# Patient Record
Sex: Male | Born: 1960 | Race: Black or African American | Hispanic: No | Marital: Married | Smoking: Never smoker
Health system: Southern US, Community
[De-identification: ages and names within clinical notes are randomized; demographics above are authoritative.]

## PROBLEM LIST (undated history)

## (undated) DIAGNOSIS — J302 Other seasonal allergic rhinitis: Secondary | ICD-10-CM

## (undated) DIAGNOSIS — J45909 Unspecified asthma, uncomplicated: Secondary | ICD-10-CM

## (undated) DIAGNOSIS — R51 Headache: Secondary | ICD-10-CM

## (undated) DIAGNOSIS — I10 Essential (primary) hypertension: Secondary | ICD-10-CM

## (undated) HISTORY — PX: OTHER SURGICAL HISTORY: SHX169

## (undated) HISTORY — PX: APPENDECTOMY: SHX54

## (undated) HISTORY — PX: TUMOR REMOVAL: SHX12

## (undated) HISTORY — PX: TONSILLECTOMY: SUR1361

---

## 1998-02-22 ENCOUNTER — Other Ambulatory Visit: Admission: RE | Admit: 1998-02-22 | Discharge: 1998-02-22 | Payer: Self-pay | Admitting: Urology

## 2004-07-24 ENCOUNTER — Encounter: Admission: RE | Admit: 2004-07-24 | Discharge: 2004-07-24 | Payer: Self-pay | Admitting: Occupational Medicine

## 2008-06-06 ENCOUNTER — Encounter: Admission: RE | Admit: 2008-06-06 | Discharge: 2008-06-06 | Payer: Self-pay | Admitting: Occupational Medicine

## 2010-10-19 ENCOUNTER — Encounter: Payer: Self-pay | Admitting: Family Medicine

## 2013-08-15 NOTE — H&P (Signed)
History of Present Illness  The patient is a 52 year old male who presents today for follow up of their neck. The patient is being followed for their left-sided neck pain (and left shoulder pain for about one year after a a fall froma ladder that has gotten worse). They are 3 week(s) out from last visit. Symptoms reported today include: pain (moved into right side of neck and right shoulder, lower central posterior neck), pain at night and numbness (in left small and ring fingers, feels tingling in left middle finger). The patient feels that they are doing poorly and report their pain level to be severe and 6.5-7.5 / 10. Current treatment includes: pain medications (Oxycodone and Flexeril from Dr Ranell Patrick). The patient presents today following EMG/NCV (07/01/2013) and SNRB (Left C6-C7 SNRB with Dr. Ethelene Hal, patient had relief for about 3 days).   Subjective Transcription  HISTORY OF PRESENT ILLNESS:  The patient returns today for follow up. The nerve conduction test done on 07/01/13 showed no evidence of peripheral neuropathy. He essentially has a normal study. He did get the left C6-7 selective nerve root block and he had an exceptionally positive result for about three to four days. There was no radicular arm pain. He felt quite good and it has gradually and significantly returned.       Allergies No Known Drug Allergies. 03/08/2013    Social History Alcohol use. current drinker; drinks beer; less than 5 per week Children. 3 Current work status. working full time Drug/Alcohol Rehab (Currently). no Exercise. Exercises weekly; does running / walking Illicit drug use. no Living situation. live with spouse Marital status. married Most recent primary occupation. IMPORTS COORDINATOR Number of flights of stairs before winded. greater than 5 Pain Contract. no Previously in rehab. no Tobacco / smoke exposure. no Tobacco use. Never smoker. never  smoker    Medication History ProAir HFA (108 (90 Base)MCG/ACT Aerosol Soln, Inhalation) Active. (prn) Hydrochlorothiazide (12.5MG  Capsule, Oral) Active. (qd) Flexeril (10MG  Tablet, 1 Oral as needed) Active. Medications Reconciled.    Objective Transcription  On clinical exam he is alert and oriented times three. No shortness of breath or chest pain. The abdomen is soft and nontender. No history of incontinence of bowel and bladder. He still has trace weakness of his triceps, biceps, and wrist extensors on the left side. He has symmetrical deep tendon reflexes. Positive Spurling's sign. Positive Lhermitte's sign on the left side. Negative Babinski. Negative Hoffman's. Intact peripheral pulses. No history of incontinence of bowel or bladder.   His MRI dated 06-03-13 shows an incompletely imaged left thyroid nodule, and I have given him a copy of this report to take to [facility] so that appropriate further work up, if indicated, could be done. 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 It is a little bit more severe at 6-7 on the left.   Assessment & Plan Current Plans l Risks of surgery include, but are not limited to: Throat pain,swallowing difficulty, hoarseness or change in voice, Death, stroke, paralysis, nerve root damage/injury, bleeding, blood clots, loss of bowel/bladder control, hardware failure, or malposition, spinal fluid leak, adjacent segment disease, non-union, need for further surgery, ongoing or worse pain, infection and recurrent disc herniation  At this point in time we have had a long discussion about anterior cervical discectomy and fusion. The patient has a confirmatory test with pain elimination at  the C6-7 injection. He has no evidence of peripheral neuropathy. 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    At this point in time given the disease process and the failure to improve with appropriate conservative management he would like to proceed with surgery. I have had an honest discussion with the patient and his wife. The risks of that include infection, bleeding, nerve damage, death, stoke, paralysis, failure to heal, failure to fuse, throat pain, swallowing difficulties, hoarseness in the voice, need for further surgery including posterior surgery, ongoing or worse pain, loss of bowel and bladder control, blood clots, adjacent segment disease. All of their questions were addressed. Because this is a multilevel procedure we will use an external bone stimulator following surgery to encourage fusion. The procedure of choice would be ACDF C5-6, C6-7 which would address the C6-7 pathology that he has on the left side. All of their questions were encouraged and addressed. We will plan on proceeding with surgery in the very near future once we have primary care clearance.

## 2013-08-16 ENCOUNTER — Encounter (HOSPITAL_COMMUNITY): Payer: Self-pay

## 2013-08-16 ENCOUNTER — Encounter (HOSPITAL_COMMUNITY)
Admission: RE | Admit: 2013-08-16 | Discharge: 2013-08-16 | Disposition: A | Discharge status: Home / Self Care | Payer: Managed Care, Other (non HMO) | Source: Ambulatory Visit | Attending: Orthopedic Surgery | Admitting: Orthopedic Surgery

## 2013-08-16 ENCOUNTER — Encounter (HOSPITAL_COMMUNITY): Payer: Self-pay | Admitting: Pharmacy Technician

## 2013-08-16 ENCOUNTER — Encounter (HOSPITAL_COMMUNITY)
Admission: RE | Admit: 2013-08-16 | Discharge: 2013-08-16 | Disposition: A | Discharge status: Home / Self Care | Payer: Managed Care, Other (non HMO) | Source: Ambulatory Visit | Attending: Anesthesiology | Admitting: Anesthesiology

## 2013-08-16 DIAGNOSIS — M542 Cervicalgia: Secondary | ICD-10-CM | POA: Diagnosis present

## 2013-08-16 DIAGNOSIS — M1990 Unspecified osteoarthritis, unspecified site: Secondary | ICD-10-CM | POA: Diagnosis not present

## 2013-08-16 DIAGNOSIS — G609 Hereditary and idiopathic neuropathy, unspecified: Secondary | ICD-10-CM | POA: Diagnosis present

## 2013-08-16 DIAGNOSIS — M129 Arthropathy, unspecified: Secondary | ICD-10-CM | POA: Diagnosis present

## 2013-08-16 DIAGNOSIS — M47812 Spondylosis without myelopathy or radiculopathy, cervical region: Secondary | ICD-10-CM | POA: Diagnosis present

## 2013-08-16 HISTORY — DX: Unspecified asthma, uncomplicated: J45.909

## 2013-08-16 HISTORY — DX: Headache: R51

## 2013-08-16 HISTORY — DX: Other seasonal allergic rhinitis: J30.2

## 2013-08-16 HISTORY — DX: Essential (primary) hypertension: I10

## 2013-08-16 LAB — BASIC METABOLIC PANEL
BUN: 16 mg/dL (ref 6–23)
CO2: 28 mEq/L (ref 19–32)
Calcium: 9.6 mg/dL (ref 8.4–10.5)
Creatinine, Ser: 1.02 mg/dL (ref 0.50–1.35)
GFR calc Af Amer: >90 mL/min (ref >90)
GFR calc non Af Amer: 84 mL/min — ABNORMAL LOW (ref >90)
Sodium: 141 mEq/L (ref 135–145)

## 2013-08-16 LAB — SURGICAL PCR SCREEN
MRSA, PCR: NEGATIVE
Staphylococcus aureus: NEGATIVE

## 2013-08-16 LAB — CBC
HCT: 43.7 % (ref 39.0–52.0)
MCHC: 36.2 g/dL — ABNORMAL HIGH (ref 30.0–36.0)
Platelets: 227 10*3/uL (ref 150–400)
RDW: 12.4 % (ref 11.5–15.5)
WBC: 6.2 10*3/uL (ref 4.0–10.5)

## 2013-08-16 MED ORDER — CEFAZOLIN SODIUM-DEXTROSE 2-3 GM-% IV SOLR
2.0000 g | INTRAVENOUS | Status: AC
  Administered 2013-08-17: 2 g via INTRAVENOUS

## 2013-08-16 NOTE — Pre-Procedure Instructions (Signed)
Jordan Kaiser  08/16/2013   Your procedure is scheduled on:  Thursday November 20 th   Report to [facility] Main Entrance "A" at call OR [phone] at 0800 AM for arrival time.  Call this number if you have problems the morning of surgery: [phone]   Remember:   Do not eat food or drink liquids after midnight Wednesday.   Take these medicines the morning of surgery with A SIP OF WATER: Hydrocodone and bring inhaler day of surgery   Do not wear jewelry.  Do not wear lotions, or powders. You may wear deodorant.             Men may shave face and neck.  Do not bring valuables to the hospital.  [facility] is not responsible for any belongings or valuables.               Contacts, dentures or bridgework may not be worn into surgery.  Leave suitcase in the car. After surgery it may be brought to your room.  For patients admitted to the hospital, discharge time is determined by your treatment team.               Patients discharged the day of surgery will not be allowed to drive home.    Special Instructions: Shower using CHG 2 nights before surgery and the night before surgery.  If you shower the day of surgery use CHG.  Use special wash - you have one bottle of CHG for all showers.  You should use approximately 1/3 of the bottle for each shower.   Please read over the following fact sheets that you were given: Pain Booklet, Coughing and Deep Breathing, MRSA Information and Surgical Site Infection Prevention

## 2013-08-17 ENCOUNTER — Encounter (HOSPITAL_COMMUNITY): Payer: Managed Care, Other (non HMO) | Admitting: Certified Registered Nurse Anesthetist

## 2013-08-17 ENCOUNTER — Encounter (HOSPITAL_COMMUNITY): Payer: Self-pay | Admitting: *Deleted

## 2013-08-17 ENCOUNTER — Inpatient Hospital Stay (HOSPITAL_COMMUNITY): Payer: Managed Care, Other (non HMO) | Admitting: Certified Registered Nurse Anesthetist

## 2013-08-17 ENCOUNTER — Encounter (HOSPITAL_COMMUNITY)
Admission: RE | Disposition: A | Discharge status: Home / Self Care | Payer: Self-pay | Source: Ambulatory Visit | Attending: Orthopedic Surgery

## 2013-08-17 ENCOUNTER — Observation Stay (HOSPITAL_COMMUNITY)
Admission: RE | Admit: 2013-08-17 | Discharge: 2013-08-18 | DRG: 473 | Disposition: A | Discharge status: Home / Self Care | Payer: Managed Care, Other (non HMO) | Source: Ambulatory Visit | Attending: Orthopedic Surgery | Admitting: Orthopedic Surgery

## 2013-08-17 ENCOUNTER — Inpatient Hospital Stay (HOSPITAL_COMMUNITY): Payer: Managed Care, Other (non HMO)

## 2013-08-17 ENCOUNTER — Other Ambulatory Visit: Payer: Self-pay

## 2013-08-17 ENCOUNTER — Observation Stay (HOSPITAL_COMMUNITY): Payer: Managed Care, Other (non HMO)

## 2013-08-17 DIAGNOSIS — M129 Arthropathy, unspecified: Secondary | ICD-10-CM | POA: Diagnosis present

## 2013-08-17 DIAGNOSIS — I10 Essential (primary) hypertension: Secondary | ICD-10-CM | POA: Diagnosis present

## 2013-08-17 DIAGNOSIS — J45909 Unspecified asthma, uncomplicated: Secondary | ICD-10-CM | POA: Diagnosis present

## 2013-08-17 DIAGNOSIS — M47812 Spondylosis without myelopathy or radiculopathy, cervical region: Principal | ICD-10-CM | POA: Diagnosis present

## 2013-08-17 DIAGNOSIS — Z01812 Encounter for preprocedural laboratory examination: Secondary | ICD-10-CM | POA: Insufficient documentation

## 2013-08-17 DIAGNOSIS — M1990 Unspecified osteoarthritis, unspecified site: Secondary | ICD-10-CM | POA: Insufficient documentation

## 2013-08-17 DIAGNOSIS — Z79899 Other long term (current) drug therapy: Secondary | ICD-10-CM | POA: Insufficient documentation

## 2013-08-17 DIAGNOSIS — Z981 Arthrodesis status: Secondary | ICD-10-CM

## 2013-08-17 DIAGNOSIS — Z01818 Encounter for other preprocedural examination: Secondary | ICD-10-CM | POA: Insufficient documentation

## 2013-08-17 DIAGNOSIS — G609 Hereditary and idiopathic neuropathy, unspecified: Secondary | ICD-10-CM | POA: Diagnosis present

## 2013-08-17 HISTORY — PX: ANTERIOR CERVICAL DECOMP/DISCECTOMY FUSION: SHX1161

## 2013-08-17 SURGERY — ANTERIOR CERVICAL DECOMPRESSION/DISCECTOMY FUSION 2 LEVELS
Anesthesia: General | Site: Spine Cervical | Wound class: Clean

## 2013-08-17 MED ORDER — SODIUM CHLORIDE 0.9 % IJ SOLN: 3.0000 mL | INTRAMUSCULAR | Status: DC | PRN

## 2013-08-17 MED ORDER — THROMBIN 20000 UNITS EX SOLR
CUTANEOUS | Status: AC
  Filled 2013-08-17: qty 20000

## 2013-08-17 MED ORDER — MENTHOL 3 MG MT LOZG: 1.0000 | LOZENGE | OROMUCOSAL | Status: DC | PRN

## 2013-08-17 MED ORDER — ARTIFICIAL TEARS OP OINT
TOPICAL_OINTMENT | OPHTHALMIC | Status: DC | PRN
  Administered 2013-08-17: 1 via OPHTHALMIC

## 2013-08-17 MED ORDER — ZOLPIDEM TARTRATE 5 MG PO TABS: 5.0000 mg | ORAL_TABLET | Freq: Every evening | ORAL | Status: DC | PRN

## 2013-08-17 MED ORDER — BUPIVACAINE-EPINEPHRINE 0.25% -1:200000 IJ SOLN
INTRAMUSCULAR | Status: DC | PRN
  Administered 2013-08-17: 3 mL

## 2013-08-17 MED ORDER — PROPOFOL 10 MG/ML IV BOLUS
INTRAVENOUS | Status: DC | PRN
  Administered 2013-08-17: 300 mg via INTRAVENOUS

## 2013-08-17 MED ORDER — PHENYLEPHRINE HCL 10 MG/ML IJ SOLN
10.0000 mg | INTRAVENOUS | Status: DC | PRN
  Administered 2013-08-17: 25 ug/min via INTRAVENOUS

## 2013-08-17 MED ORDER — DEXAMETHASONE 4 MG PO TABS
4.0000 mg | ORAL_TABLET | Freq: Four times a day (QID) | ORAL | Status: DC
  Administered 2013-08-18: 4 mg via ORAL
  Filled 2013-08-17 (×7): qty 1

## 2013-08-17 MED ORDER — CEFAZOLIN SODIUM 1-5 GM-% IV SOLN
1.0000 g | Freq: Three times a day (TID) | INTRAVENOUS | Status: AC
  Administered 2013-08-17 – 2013-08-18 (×2): 1 g via INTRAVENOUS
  Filled 2013-08-17 (×2): qty 50

## 2013-08-17 MED ORDER — FENTANYL CITRATE 0.05 MG/ML IJ SOLN
INTRAMUSCULAR | Status: DC | PRN
  Administered 2013-08-17: 175 ug via INTRAVENOUS
  Administered 2013-08-17: 75 ug via INTRAVENOUS

## 2013-08-17 MED ORDER — GLYCOPYRROLATE 0.2 MG/ML IJ SOLN
INTRAMUSCULAR | Status: DC | PRN
  Administered 2013-08-17: 0.4 mg via INTRAVENOUS

## 2013-08-17 MED ORDER — NEOSTIGMINE METHYLSULFATE 1 MG/ML IJ SOLN
INTRAMUSCULAR | Status: DC | PRN
  Administered 2013-08-17: 3 mg via INTRAVENOUS

## 2013-08-17 MED ORDER — LIDOCAINE HCL (CARDIAC) 20 MG/ML IV SOLN
INTRAVENOUS | Status: DC | PRN
  Administered 2013-08-17: 100 mg via INTRAVENOUS

## 2013-08-17 MED ORDER — ALBUTEROL SULFATE HFA 108 (90 BASE) MCG/ACT IN AERS
1.0000 | INHALATION_SPRAY | Freq: Four times a day (QID) | RESPIRATORY_TRACT | Status: DC | PRN
Start: 2013-08-17 — End: 2013-08-18
  Filled 2013-08-17 (×2): qty 6.7

## 2013-08-17 MED ORDER — ACETAMINOPHEN 10 MG/ML IV SOLN
INTRAVENOUS | Status: DC | PRN
  Administered 2013-08-17: 1000 mg via INTRAVENOUS

## 2013-08-17 MED ORDER — ACETAMINOPHEN 10 MG/ML IV SOLN
1000.0000 mg | INTRAVENOUS | Status: DC
  Filled 2013-08-17: qty 100

## 2013-08-17 MED ORDER — HYDROCHLOROTHIAZIDE 12.5 MG PO CAPS
12.5000 mg | ORAL_CAPSULE | Freq: Every day | ORAL | Status: DC
  Administered 2013-08-18: 12.5 mg via ORAL
  Filled 2013-08-17: qty 1

## 2013-08-17 MED ORDER — SODIUM CHLORIDE 0.9 % IJ SOLN
3.0000 mL | Freq: Two times a day (BID) | INTRAMUSCULAR | Status: DC
  Administered 2013-08-18: 3 mL via INTRAVENOUS

## 2013-08-17 MED ORDER — SODIUM CHLORIDE 0.9 % IV SOLN
250.0000 mL | INTRAVENOUS | Status: DC
  Administered 2013-08-17: 250 mL via INTRAVENOUS

## 2013-08-17 MED ORDER — LACTATED RINGERS IV SOLN
INTRAVENOUS | Status: DC | PRN
  Administered 2013-08-17 (×2): via INTRAVENOUS

## 2013-08-17 MED ORDER — 0.9 % SODIUM CHLORIDE (POUR BTL) OPTIME
TOPICAL | Status: DC | PRN
  Administered 2013-08-17: 1000 mL

## 2013-08-17 MED ORDER — DEXAMETHASONE SODIUM PHOSPHATE 4 MG/ML IJ SOLN
4.0000 mg | Freq: Once | INTRAMUSCULAR | Status: AC
  Administered 2013-08-17: 8 mg via INTRAVENOUS
  Filled 2013-08-17: qty 1

## 2013-08-17 MED ORDER — METHOCARBAMOL 500 MG PO TABS
500.0000 mg | ORAL_TABLET | Freq: Four times a day (QID) | ORAL | Status: DC | PRN
  Administered 2013-08-17: 500 mg via ORAL
  Filled 2013-08-17: qty 1

## 2013-08-17 MED ORDER — HYDROMORPHONE HCL PF 1 MG/ML IJ SOLN
INTRAMUSCULAR | Status: AC
  Filled 2013-08-17: qty 1

## 2013-08-17 MED ORDER — MIDAZOLAM HCL 5 MG/5ML IJ SOLN
INTRAMUSCULAR | Status: DC | PRN
  Administered 2013-08-17: 2 mg via INTRAVENOUS

## 2013-08-17 MED ORDER — LACTATED RINGERS IV SOLN
INTRAVENOUS | Status: DC
  Administered 2013-08-17: 13:00:00 via INTRAVENOUS

## 2013-08-17 MED ORDER — ACETAMINOPHEN 10 MG/ML IV SOLN
1000.0000 mg | Freq: Four times a day (QID) | INTRAVENOUS | Status: DC
  Administered 2013-08-18 (×3): 1000 mg via INTRAVENOUS
  Filled 2013-08-17 (×4): qty 100

## 2013-08-17 MED ORDER — PHENOL 1.4 % MT LIQD: 1.0000 | OROMUCOSAL | Status: DC | PRN

## 2013-08-17 MED ORDER — DEXAMETHASONE SODIUM PHOSPHATE 4 MG/ML IJ SOLN
4.0000 mg | Freq: Four times a day (QID) | INTRAMUSCULAR | Status: DC
  Administered 2013-08-18: 4 mg via INTRAVENOUS
  Filled 2013-08-17 (×6): qty 1

## 2013-08-17 MED ORDER — METHOCARBAMOL 500 MG PO TABS
ORAL_TABLET | ORAL | Status: AC
  Filled 2013-08-17: qty 1

## 2013-08-17 MED ORDER — ROCURONIUM BROMIDE 100 MG/10ML IV SOLN
INTRAVENOUS | Status: DC | PRN
  Administered 2013-08-17: 50 mg via INTRAVENOUS
  Administered 2013-08-17 (×2): 20 mg via INTRAVENOUS
  Administered 2013-08-17: 30 mg via INTRAVENOUS

## 2013-08-17 MED ORDER — HYDROMORPHONE HCL PF 1 MG/ML IJ SOLN
0.2500 mg | INTRAMUSCULAR | Status: DC | PRN
  Administered 2013-08-17 (×3): 0.5 mg via INTRAVENOUS

## 2013-08-17 MED ORDER — OXYCODONE HCL 5 MG/5ML PO SOLN: 5.0000 mg | Freq: Once | ORAL | Status: DC | PRN

## 2013-08-17 MED ORDER — OXYCODONE HCL 5 MG PO TABS
10.0000 mg | ORAL_TABLET | ORAL | Status: DC | PRN
  Administered 2013-08-17 – 2013-08-18 (×3): 10 mg via ORAL
  Filled 2013-08-17 (×3): qty 2

## 2013-08-17 MED ORDER — LACTATED RINGERS IV SOLN: INTRAVENOUS | Status: DC

## 2013-08-17 MED ORDER — ONDANSETRON HCL 4 MG/2ML IJ SOLN
INTRAMUSCULAR | Status: DC | PRN
  Administered 2013-08-17: 4 mg via INTRAVENOUS

## 2013-08-17 MED ORDER — ACETAMINOPHEN 10 MG/ML IV SOLN: 1000.0000 mg | Freq: Four times a day (QID) | INTRAVENOUS | Status: DC

## 2013-08-17 MED ORDER — OXYCODONE HCL 5 MG PO TABS: 5.0000 mg | ORAL_TABLET | Freq: Once | ORAL | Status: DC | PRN

## 2013-08-17 MED ORDER — METHOCARBAMOL 100 MG/ML IJ SOLN
500.0000 mg | Freq: Four times a day (QID) | INTRAVENOUS | Status: DC | PRN
  Filled 2013-08-17: qty 5

## 2013-08-17 MED ORDER — ONDANSETRON HCL 4 MG/2ML IJ SOLN: 4.0000 mg | INTRAMUSCULAR | Status: DC | PRN

## 2013-08-17 MED ORDER — MORPHINE SULFATE 2 MG/ML IJ SOLN: 1.0000 mg | INTRAMUSCULAR | Status: DC | PRN

## 2013-08-17 MED ORDER — THROMBIN 20000 UNITS EX SOLR
CUTANEOUS | Status: DC | PRN
  Administered 2013-08-17: 19:00:00 via TOPICAL

## 2013-08-17 SURGICAL SUPPLY — 60 items
BLADE SURG ROTATE 9660 (MISCELLANEOUS) IMPLANT
BUR EGG ELITE 4.0 (BURR) IMPLANT
BUR MATCHSTICK NEURO 3.0 LAGG (BURR) IMPLANT
CANISTER SUCTION 2500CC (MISCELLANEOUS) ×2 IMPLANT
CLOTH BEACON ORANGE TIMEOUT ST (SAFETY) ×2 IMPLANT
CLSR STERI-STRIP ANTIMIC 1/2X4 (GAUZE/BANDAGES/DRESSINGS) ×2 IMPLANT
CORDS BIPOLAR (ELECTRODE) ×2 IMPLANT
COVER SURGICAL LIGHT HANDLE (MISCELLANEOUS) ×4 IMPLANT
CRADLE DONUT ADULT HEAD (MISCELLANEOUS) ×2 IMPLANT
DEVICE ENDSKLTN MED 6 7MM (Orthopedic Implant) IMPLANT
DRAPE C-ARM 42X72 X-RAY (DRAPES) ×2 IMPLANT
DRAPE POUCH INSTRU U-SHP 10X18 (DRAPES) ×2 IMPLANT
DRAPE SURG 17X23 STRL (DRAPES) ×2 IMPLANT
DRAPE U-SHAPE 47X51 STRL (DRAPES) ×2 IMPLANT
DRSG MEPILEX BORDER 4X4 (GAUZE/BANDAGES/DRESSINGS) ×2 IMPLANT
DURAPREP 26ML APPLICATOR (WOUND CARE) ×2 IMPLANT
ELECT COATED BLADE 2.86 ST (ELECTRODE) ×2 IMPLANT
ELECT REM PT RETURN 9FT ADLT (ELECTROSURGICAL) ×2
ELECTRODE REM PT RTRN 9FT ADLT (ELECTROSURGICAL) ×1 IMPLANT
ENDOSKELETON MED 6 7MM (Orthopedic Implant) ×4 IMPLANT
GLOVE BIOGEL PI IND STRL 8 (GLOVE) ×1 IMPLANT
GLOVE BIOGEL PI IND STRL 8.5 (GLOVE) ×1 IMPLANT
GLOVE BIOGEL PI INDICATOR 8 (GLOVE) ×1
GLOVE BIOGEL PI INDICATOR 8.5 (GLOVE) ×1
GLOVE ECLIPSE 8.5 STRL (GLOVE) ×2 IMPLANT
GLOVE ORTHO TXT STRL SZ7.5 (GLOVE) ×2 IMPLANT
GOWN PREVENTION PLUS XXLARGE (GOWN DISPOSABLE) ×2 IMPLANT
GOWN STRL REIN 2XL XLG LVL4 (GOWN DISPOSABLE) ×2 IMPLANT
GOWN STRL REIN XL XLG (GOWN DISPOSABLE) ×4 IMPLANT
KIT BASIN OR (CUSTOM PROCEDURE TRAY) ×2 IMPLANT
KIT ROOM TURNOVER OR (KITS) ×2 IMPLANT
NDL SPNL 18GX3.5 QUINCKE PK (NEEDLE) ×1 IMPLANT
NEEDLE SPNL 18GX3.5 QUINCKE PK (NEEDLE) ×2 IMPLANT
NS IRRIG 1000ML POUR BTL (IV SOLUTION) ×2 IMPLANT
PACK ORTHO CERVICAL (CUSTOM PROCEDURE TRAY) ×2 IMPLANT
PACK UNIVERSAL I (CUSTOM PROCEDURE TRAY) ×2 IMPLANT
PAD ARMBOARD 7.5X6 YLW CONV (MISCELLANEOUS) ×4 IMPLANT
PATTIES SURGICAL .25X.25 (GAUZE/BANDAGES/DRESSINGS) ×2 IMPLANT
PIN RETAINER PRODISC 14 MM (PIN) ×2 IMPLANT
PLATE SKYLINE TWO LEVEL 32MM (Plate) ×1 IMPLANT
PUTTY BONE DBX 5CC MIX (Putty) ×2 IMPLANT
RESTRAINT LIMB HOLDER UNIV (RESTRAINTS) ×2 IMPLANT
SCREW SKYLINE 14MM SD-VA (Screw) ×6 IMPLANT
SPONGE INTESTINAL PEANUT (DISPOSABLE) ×2 IMPLANT
SPONGE LAP 4X18 X RAY DECT (DISPOSABLE) ×4 IMPLANT
SPONGE SURGIFOAM ABS GEL 100 (HEMOSTASIS) ×2 IMPLANT
SURGIFLO TRUKIT (HEMOSTASIS) IMPLANT
SUT MON AB 3-0 SH 27 (SUTURE) ×2
SUT MON AB 3-0 SH27 (SUTURE) ×1 IMPLANT
SUT SILK 2 0 (SUTURE)
SUT SILK 2-0 18XBRD TIE 12 (SUTURE) IMPLANT
SUT VIC AB 2-0 CT1 18 (SUTURE) ×2 IMPLANT
SYR BULB IRRIGATION 50ML (SYRINGE) ×2 IMPLANT
SYR CONTROL 10ML LL (SYRINGE) ×2 IMPLANT
TAPE CLOTH 4X10 WHT NS (GAUZE/BANDAGES/DRESSINGS) ×2 IMPLANT
TAPE UMBILICAL COTTON 1/8X30 (MISCELLANEOUS) ×2 IMPLANT
TOWEL OR 17X24 6PK STRL BLUE (TOWEL DISPOSABLE) ×2 IMPLANT
TOWEL OR 17X26 10 PK STRL BLUE (TOWEL DISPOSABLE) ×2 IMPLANT
TRAY FOLEY CATH 16FRSI W/METER (SET/KITS/TRAYS/PACK) IMPLANT
WATER STERILE IRR 1000ML POUR (IV SOLUTION) ×1 IMPLANT

## 2013-08-17 NOTE — Anesthesia Postprocedure Evaluation (Signed)
  Anesthesia Post-op Note  Patient: Jordan Kaiser  Procedure(s) Performed: Procedure(s): ANTERIOR CERVICAL DECOMPRESSION/DISCECTOMY FUSION 2 LEVELS C5-7 (N/A)  Patient Location: PACU  Anesthesia Type:General  Level of Consciousness: awake and alert   Airway and Oxygen Therapy: Patient Spontanous Breathing  Post-op Pain: mild  Post-op Assessment: Post-op Vital signs reviewed, Patient's Cardiovascular Status Stable and Respiratory Function Stable  Post-op Vital Signs: Reviewed  Filed Vitals:   08/17/13 2115  BP: 129/75  Pulse: 95  Temp: 36.5 C  Resp: 10    Complications: No apparent anesthesia complications

## 2013-08-17 NOTE — Brief Op Note (Signed)
08/17/2013  7:42 PM  PATIENT:  Jordan Kaiser  52 y.o. male  PRE-OPERATIVE DIAGNOSIS:  CERVICAL SPONDOLYTIC RADICULOPATHY  POST-OPERATIVE DIAGNOSIS:  CERVICAL SPONDOLYTIC RADICULOPATHY  PROCEDURE:  Procedure(s): ANTERIOR CERVICAL DECOMPRESSION/DISCECTOMY FUSION 2 LEVELS C5-7 (N/A)  SURGEON:  Surgeon(s) and Role:    * Venita Lick, MD - Primary  PHYSICIAN ASSISTANT:   ASSISTANTS: Zonia Kief   ANESTHESIA:   general  EBL:  Total I/O In: 150 [I.V.:150] Out: -   BLOOD ADMINISTERED:none  DRAINS: none   LOCAL MEDICATIONS USED:  MARCAINE     SPECIMEN:  No Specimen  DISPOSITION OF SPECIMEN:  N/A  COUNTS:  YES  TOURNIQUET:  * No tourniquets in log *  DICTATION: .Other Dictation: Dictation Number [id number]  PLAN OF CARE: Admit for overnight observation  PATIENT DISPOSITION:  PACU - hemodynamically stable.

## 2013-08-17 NOTE — Anesthesia Preprocedure Evaluation (Addendum)
Anesthesia Evaluation  Patient identified by MRN, date of birth, ID band Patient awake    Reviewed: Allergy & Precautions, H&P , NPO status , Patient's Chart, lab work & pertinent test results  Airway Mallampati: II TM Distance: >3 FB Neck ROM: Limited  Mouth opening: Limited Mouth Opening  Dental  (+) Teeth Intact, Dental Advisory Given and Caps   Pulmonary asthma ,    Pulmonary exam normal       Cardiovascular hypertension, Pt. on medications Rhythm:Regular Rate:Normal     Neuro/Psych  Headaches,    GI/Hepatic negative GI ROS, Neg liver ROS,   Endo/Other  negative endocrine ROS  Renal/GU negative Renal ROS     Musculoskeletal  (+) Arthritis -, Osteoarthritis,    Abdominal Normal abdominal exam  (+)   Peds  Hematology negative hematology ROS (+)   Anesthesia Other Findings Full uppers are crowned, many lower in the front are also crowned.  Reproductive/Obstetrics                        Anesthesia Physical Anesthesia Plan  ASA: II  Anesthesia Plan: General   Post-op Pain Management:    Induction: Intravenous  Airway Management Planned: Oral ETT  Additional Equipment:   Intra-op Plan:   Post-operative Plan:   Informed Consent: I have reviewed the patients History and Physical, chart, labs and discussed the procedure including the risks, benefits and alternatives for the proposed anesthesia with the patient or authorized representative who has indicated his/her understanding and acceptance.   Dental advisory given  Plan Discussed with:   Anesthesia Plan Comments:         Anesthesia Quick Evaluation

## 2013-08-17 NOTE — H&P (Signed)
No change in clinical exam H+P reviewed  

## 2013-08-17 NOTE — Transfer of Care (Signed)
Immediate Anesthesia Transfer of Care Note  Patient: Jordan Kaiser  Procedure(s) Performed: Procedure(s): ANTERIOR CERVICAL DECOMPRESSION/DISCECTOMY FUSION 2 LEVELS C5-7 (N/A)  Patient Location: PACU  Anesthesia Type:General  Level of Consciousness: awake, alert , oriented and patient cooperative  Airway & Oxygen Therapy: Patient Spontanous Breathing and Patient connected to nasal cannula oxygen  Post-op Assessment: Report given to PACU RN, Post -op Vital signs reviewed and stable and Patient moving all extremities X 4  Post vital signs: Reviewed and stable  Complications: No apparent anesthesia complications

## 2013-08-17 NOTE — Preoperative (Addendum)
Beta Blockers   Reason not to administer Beta Blockers:Not Applicable 

## 2013-08-17 NOTE — Anesthesia Procedure Notes (Signed)
Procedure Name: Intubation Date/Time: 08/17/2013 4:34 PM Performed by: Tyrone Nine Pre-anesthesia Checklist: Patient identified, Timeout performed, Emergency Drugs available, Suction available and Patient being monitored Patient Re-evaluated:Patient Re-evaluated prior to inductionOxygen Delivery Method: Circle system utilized Preoxygenation: Pre-oxygenation with 100% oxygen Intubation Type: IV induction Ventilation: Mask ventilation without difficulty Laryngoscope Size: Mac and 4 Grade View: Grade II Tube type: Oral Tube size: 8.0 mm Number of attempts: 1 Airway Equipment and Method: Stylet Placement Confirmation: ETT inserted through vocal cords under direct vision,  positive ETCO2 and breath sounds checked- equal and bilateral Secured at: 23 cm Tube secured with: Tape Dental Injury: Teeth and Oropharynx as per pre-operative assessment

## 2013-08-18 DIAGNOSIS — M47812 Spondylosis without myelopathy or radiculopathy, cervical region: Secondary | ICD-10-CM | POA: Diagnosis not present

## 2013-08-18 MED ORDER — ONDANSETRON 4 MG PO TBDP: 4.0000 mg | ORAL_TABLET | Freq: Three times a day (TID) | ORAL | Status: DC | PRN

## 2013-08-18 MED ORDER — METHOCARBAMOL 500 MG PO TABS: 500.0000 mg | ORAL_TABLET | Freq: Four times a day (QID) | ORAL | Status: DC | PRN

## 2013-08-18 MED ORDER — OXYCODONE-ACETAMINOPHEN 5-325 MG PO TABS: 1.0000 | ORAL_TABLET | ORAL | Status: DC | PRN

## 2013-08-18 MED ORDER — POLYETHYLENE GLYCOL 3350 17 G PO PACK: 17.0000 g | PACK | Freq: Every day | ORAL | Status: DC

## 2013-08-18 MED ORDER — DOCUSATE SODIUM 100 MG PO CAPS: 100.0000 mg | ORAL_CAPSULE | Freq: Two times a day (BID) | ORAL | Status: DC

## 2013-08-18 NOTE — Progress Notes (Signed)
Pt stated feeling severe crushing chest pain at a level of 8 of 10 with SOB. On call provider was paged. EKG was normal. VS were stable. Pt has hx of asthma and had albuterol PRN ordered. RT called to assess pt and need for albuterol. On call provider ordered to administer albuterol if needed and to continue to monitor. If pain did not subside after albuterol admission to page them back. Will continue to monitor.

## 2013-08-18 NOTE — Progress Notes (Signed)
    Subjective: Procedure(s) (LRB): ANTERIOR CERVICAL DECOMPRESSION/DISCECTOMY FUSION 2 LEVELS C5-7 (N/A) 1 Day Post-Op  Patient reports pain as 2 on 0-10 scale.  Reports decreased arm pain reports incisional neck pain   Positive void Negative bowel movement Positive flatus Positive chest pain overnight.  EKG negative.  Issue resolved  Objective: Vital signs in last 24 hours: Temp:  [97.2 F (36.2 C)-98.4 F (36.9 C)] 98.1 F (36.7 C) (11/21 0634) Pulse Rate:  [75-100] 91 (11/21 0634) Resp:  [4-20] 14 (11/21 0634) BP: (124-137)/(66-96) 127/66 mmHg (11/21 0634) SpO2:  [98 %-100 %] 100 % (11/21 0634)  Intake/Output from previous day: 11/20 0701 - 11/21 0700 In: 1888.3 [P.O.:120; I.V.:1568.3; IV Piggyback:200] Out: 565 [Urine:550; Blood:15]  Labs:  Recent Labs  08/16/13 1210  WBC 6.2  RBC 5.03  HCT 43.7  PLT 227    Recent Labs  08/16/13 1210  NA 141  K 3.9  CL 102  CO2 28  BUN 16  CREATININE 1.02  GLUCOSE 101*  CALCIUM 9.6   No results found for this basename: LABPT, INR,  in the last 72 hours  Physical Exam: Neurologically intact ABD soft Neurovascular intact Incision: dressing C/D/I and no drainage Compartment soft  Assessment/Plan: Patient stable  xrays satisfactory Mobilization with physical therapy Encourage incentive spirometry Continue care  Advance diet Up with therapy Episode of chest pain overnight which has resolved - most likely myofascial. Patient has voided with discomfort but denies hematuria. Ok for d/c this afternoon with urinary discomfort resolved.  If it does not will contact Dr Brunilda Payor (urology) for evaluation.   Venita Lick, MD [facility] [phone]

## 2013-08-18 NOTE — Progress Notes (Signed)
08/18/13 PT recommended outpatient PT. No equipment needs identified. Jacquelynn Cree RN, BSN, CCM

## 2013-08-18 NOTE — Progress Notes (Signed)
Received call from RN requesting to come to assess pt. Upon arrival pt. Stated that he felt much better. Pt. Explained that he was having chest pain that was causing him to be short of breath & he began to have a panic attack. BBS: RUL & LUL: Clear, RLL & LLL: Diminished, HR: 104, SAT's: 98% on 1.5L [place]. RN was made aware of RT assessment & that the pt. Was feeling much better.

## 2013-08-18 NOTE — Evaluation (Signed)
Physical Therapy Evaluation Patient Details Name: Jordan Kaiser MRN: [id number] DOB: September 26, 1961 Today's Date: 08/18/2013 Time: 4098-1191 PT Time Calculation (min): 25 min  PT Assessment / Plan / Recommendation History of Present Illness  Pt is a 52 y/o male admitted s/p anterior cervical decompression/discectomy fusion C5-C7.  Clinical Impression  This patient presents with acute pain and decreased functional independence following the above mentioned procedure. At the time of PT eval, pt required supervision for all functional mobility, as he felt somewhat dizzy the first time out of bed. Dizziness resolved by end of session and pt reports pain of 3/10. This patient is appropriate for skilled PT interventions in the acute care setting to address functional limitations, improve safety and independence with functional mobility, and return to PLOF.      PT Assessment  Patient needs continued PT services    Follow Up Recommendations  Outpatient PT (when appropriate)    Does the patient have the potential to tolerate intense rehabilitation      Barriers to Discharge        Equipment Recommendations  None recommended by PT    Recommendations for Other Services     Frequency Min 5X/week    Precautions / Restrictions Precautions Precautions: Fall;Cervical Required Braces or Orthoses: Cervical Brace Cervical Brace: Hard collar Restrictions Weight Bearing Restrictions: No   Pertinent Vitals/Pain 3/10 at rest      Mobility  Bed Mobility Bed Mobility: Not assessed Supine to Sit: 5: Supervision;HOB elevated Sitting - Scoot to Edge of Bed: 5: Supervision Details for Bed Mobility Assistance: VC's to maintain cervical precautions Transfers Transfers: Sit to Stand;Stand to Sit Sit to Stand: 5: Supervision;From bed;With upper extremity assist Stand to Sit: 5: Supervision;To chair/3-in-1;With upper extremity assist Details for Transfer Assistance: VC's for hand placement on  seated surface. Ambulation/Gait Ambulation/Gait Assistance: 5: Supervision Ambulation Distance (Feet): 150 Feet Assistive device: Rolling walker;None Ambulation/Gait Assistance Details: RW for 1/2 distance as pt reports feeling slightly dizzy upon sitting and standing. No AD used for last 1/2 of distance. Gait Pattern: Within Functional Limits Gait velocity: Slightly decreased Stairs: Yes Stairs Assistance: 4: Min guard Stair Management Technique: One rail Left;Alternating pattern Number of Stairs: 12    Exercises     PT Diagnosis: Acute pain  PT Problem List: Decreased strength;Decreased range of motion;Decreased activity tolerance;Decreased safety awareness;Decreased knowledge of precautions;Pain PT Treatment Interventions: Gait training;Stair training;Functional mobility training;Therapeutic activities;Therapeutic exercise;Neuromuscular re-education;Patient/family education     PT Goals(Current goals can be found in the care plan section) Acute Rehab PT Goals Patient Stated Goal: To return home with family PT Goal Formulation: With patient Time For Goal Achievement: 08/25/13 Potential to Achieve Goals: Good  Visit Information  Last PT Received On: 08/18/13 Assistance Needed: +1 History of Present Illness: Pt is a 52 y/o male admitted s/p anterior cervical decompression/discectomy fusion C5-C7.       Prior Functioning  Home Living Family/patient expects to be discharged to:: Private residence Living Arrangements: Spouse/significant other;Children Available Help at Discharge: Family;Available 24 hours/day Type of Home: House Home Access: Stairs to enter [employer] of Steps: 3 Entrance Stairs-Rails: Right Home Layout: Two level;Able to live on main level with bedroom/bathroom Alternate Level Stairs-Number of Steps: 12 Alternate Level Stairs-Rails: Left Home Equipment: None Prior Function Level of Independence: Independent Communication Communication: No  difficulties Dominant Hand: Right    Cognition  Cognition Arousal/Alertness: Awake/alert Behavior During Therapy: WFL for tasks assessed/performed Overall Cognitive Status: Within Functional Limits for tasks assessed  Extremity/Trunk Assessment Upper Extremity Assessment Upper Extremity Assessment: Defer to OT evaluation Lower Extremity Assessment Lower Extremity Assessment: Overall WFL for tasks assessed Cervical / Trunk Assessment Cervical / Trunk Assessment: Normal   Balance    End of Session PT - End of Session Equipment Utilized During Treatment: Gait belt;Cervical collar Activity Tolerance: Patient tolerated treatment well Patient left: in chair;with call bell/phone within reach Nurse Communication: Mobility status  GP     Ruthann Cancer 08/18/2013, 11:35 AM  Ruthann Cancer, PT, DPT [phone]

## 2013-08-18 NOTE — Care Management Utilization Note (Signed)
Utilization review completed. Fidelia Cathers, RN BSN 

## 2013-08-18 NOTE — Evaluation (Signed)
Occupational Therapy Evaluation Patient Details Name: Jordan Kaiser MRN: [id number] DOB: 02-14-1961 Today's Date: 08/18/2013 Time: 4098-1191 OT Time Calculation (min): 18 min  OT Assessment / Plan / Recommendation History of present illness Pt is a 52 y/o male admitted s/p anterior cervical decompression/discectomy fusion C5-C7.   Clinical Impression   Pt doing well and is at Mod I - sup level with ADLs and ADL mobility safety. Pt provided with handout for cervical precautions for earlier PT session. Pt and his wife provided with education and demo of ADL A/E for use at home. All education completed and no further acute OT services indicated at this time    OT Assessment  Patient does not need any further OT services    Follow Up Recommendations  No OT follow up;Supervision - Intermittent    Barriers to Discharge  none    Equipment Recommendations  None recommended by OT    Recommendations for Other Services    Frequency       Precautions / Restrictions Precautions Precautions: Fall;Cervical Required Braces or Orthoses: Cervical Brace Cervical Brace: Hard collar Restrictions Weight Bearing Restrictions: No   Pertinent Vitals/Pain 2/10    ADL  Grooming: Performed;Wash/dry hands;Wash/dry face;Supervision/safety;Modified independent Where Assessed - Grooming: Unsupported standing Upper Body Bathing: Simulated;Modified independent;Supervision/safety Lower Body Bathing: Simulated;Modified independent;Supervision/safety Upper Body Dressing: Performed;Modified independent;Supervision/safety Lower Body Dressing: Performed;Modified independent;Supervision/safety Toilet Transfer: Performed;Modified independent;Supervision/safety Toilet Transfer Method: Sit to Barista: Regular height toilet;Grab bars Toileting - Clothing Manipulation and Hygiene: Performed;Supervision/safety;Modified independent Where Assessed - Engineer, mining and  Hygiene: Standing Tub/Shower Transfer: Performed;Supervision/safety;Modified independent Web designer Method: Science writer: Shower seat without back;Grab bars;Walk in shower ADL Comments: provided pt and his wife with education and demo of ADL A/E for use at home    OT Diagnosis:    OT Problem List:   OT Treatment Interventions:     OT Goals(Current goals can be found in the care plan section) Acute Rehab OT Goals Patient Stated Goal: To return home with family  Visit Information  Last OT Received On: 08/18/13 Assistance Needed: +1 History of Present Illness: Pt is a 52 y/o male admitted s/p anterior cervical decompression/discectomy fusion C5-C7.       Prior Functioning     Home Living Family/patient expects to be discharged to:: Private residence Living Arrangements: Spouse/significant other;Children Available Help at Discharge: Family;Available 24 hours/day Type of Home: House Home Access: Stairs to enter [employer] of Steps: 3 Entrance Stairs-Rails: Right Home Layout: Two level;Able to live on main level with bedroom/bathroom Alternate Level Stairs-Number of Steps: 12 Alternate Level Stairs-Rails: Left Home Equipment: None Prior Function Level of Independence: Independent Communication Communication: No difficulties Dominant Hand: Right         Vision/Perception Vision - History Baseline Vision: Wears glasses all the time Patient Visual Report: No change from baseline Perception Perception: Within Functional Limits   Cognition  Cognition Arousal/Alertness: Awake/alert Behavior During Therapy: WFL for tasks assessed/performed Overall Cognitive Status: Within Functional Limits for tasks assessed    Extremity/Trunk Assessment Upper Extremity Assessment Upper Extremity Assessment: Overall WFL for tasks assessed Lower Extremity Assessment Lower Extremity Assessment: Defer to PT evaluation Cervical / Trunk  Assessment Cervical / Trunk Assessment: Normal     Mobility Bed Mobility Bed Mobility: Not assessed Supine to Sit: 5: Supervision;HOB elevated Sitting - Scoot to Edge of Bed: 5: Supervision Details for Bed Mobility Assistance: pt up in recliner Transfers Sit to Stand: 5: Supervision;From chair/3-in-1;From toilet;Other (comment);With armrests (  with grab bar) Stand to Sit: 5: Supervision;To chair/3-in-1;To toilet;Other (comment);With armrests (with grab bar)      Exercise     Balance Balance Balance Assessed: Yes Dynamic Sitting Balance Dynamic Sitting - Balance Support: No upper extremity supported;Feet unsupported;During functional activity Dynamic Sitting - Level of Assistance: 7: Independent Dynamic Standing Balance Dynamic Standing - Balance Support: No upper extremity supported;During functional activity Dynamic Standing - Level of Assistance: 5: Stand by assistance   End of Session OT - End of Session Activity Tolerance: Patient tolerated treatment well Patient left: in chair;with call bell/phone within reach;with family/visitor present  GO     Galen Manila 08/18/2013, 12:38 PM

## 2013-08-18 NOTE — Op Note (Signed)
Jordan Kaiser, Jordan Kaiser NO.:  [id number]  MEDICAL RECORD NO.:  [id number]  LOCATION:  [id number]                        FACILITY:  [facility]  PHYSICIAN:  Alvy Beal, MD    DATE OF BIRTH:  01-08-1961  DATE OF PROCEDURE:  08/17/2013 DATE OF DISCHARGE:                              OPERATIVE REPORT   PREOPERATIVE DIAGNOSIS:  Cervical spondylotic radiculopathy, C5-6, C6-7.  POSTOPERATIVE DIAGNOSIS:  Cervical spondylotic radiculopathy, C5-6, C6- 7.  OPERATIVE PROCEDURE:  Anterior cervical diskectomy and fusion, C5-6, C6- 7.  INSTRUMENTATION SYSTEM USED:  Titan titanium intervertebral cages, size 7 medium lordotic cages packed with DBX mix with [employer]ynthe[employer] Guideline anterior cervical plate size 32 with 14 mm locking screws.  It should be noted that prior to the start of the case, an attempt was made to place a Foley by the nurse.  The Foley was placed according to the nurse, they did not have urine, but they inflated the balloon and noted bleeding into the Foley.  The balloon was deflated and removed and there was blood at the urethral meatus.  It was at this point that I was notified and I came into the room.  I did speak to Dr. Bradly Bienenstock, his urologist, he indicated that there is nothing acute that is required, if the patient had difficulty voiding to contact him and he would come to place a Foley if needed.  However, I did not think Foley was needed for this case.  This was discussed with the patient's wife and will be discussed with the patient himself tomorrow morning on rounds once he is more alert and orientated.  OPERATIVE NOTE:  The patient was brought to the operating room, placed supine on the operating table.  After successful induction of general anesthesia and endotracheal intubation, TEDs, SCDs were applied.  Rolled towels were placed between the shoulder blades.  The arms were secured down at the side and the anterior cervical spine was prepped and  draped in the standard fashion.  Time-out was taken to confirm patient, procedure, and all other pertinent important data.  Once this was completed, I then made a transverse incision centered over the C6 vertebral body.  I started from the midline and proceeded to the left with standard Clementeen Graham approach.  I dissected down to the platysma, platysma was sacrificed.  I then sharply dissected in the deep cervical fascia sweeping the trachea and esophagus to the right.  I identified the omohyoid muscle and sacrificed it.  This allowed me excellent visualization of the lower cervical spine.  I then identified the carotid sheath, protected it with a finger, and then continued to mainly dissect and mobilize the esophagus and trachea to the right.  A self-retaining retractor was placed and then began sweeping the prevertebral fascia using Kittner dissectors.  Once this was done, I had excellent visualization of the anterior cervical spine.  A needle was placed on top of this and I confirmed that I was at the appropriate level.  Once I confirmed that I was at the C5-6 level, I then mobilized the longus colli muscles from the midbody of C5 to the midbody of C7.  I then placed  a self-retaining retractor into the wound, deflated the endotracheal cuff, expanded the retractor to the appropriate width, and then reinflated the cuff.  An annulotomy was performed with a 15 blade scalpel at the C6-7 level. I removed the bulk of the disk material using pituitary rongeurs.  I used a 2 mm Kerrison to resect the overhanging osteophyte from the inferior aspect of the C6 vertebral body.  I then placed distraction pins into the body of C6 and C7, distracted the intervertebral space, and then maintained the distraction with the distraction pins.  Once this was done, I then continued my dissection using nerve curettes and Kerrison rongeurs.  The posterior annulus was identified using a small nerve hook.  I  dissected through the posterior annulus and the posterior longitudinal ligament.  This created a plane between the underlying thecal sac and the posterior longitudinal ligament.  Using a 1 mm Kerrison rongeur, I resected the posterior longitudinal ligament as well as the overhanging osteophytes at the posterior aspect of the 6 and 7 vertebral bodies.  I then went under the uncovertebral joint, and removed the osteophytes at this level as well.  At this point, I rasped the endplates and then measured using appropriate size trial, I was quite pleased with the decompression.  I then packed a 7-mm medium lordotic Titan cage and malleted into position.  I had excellent purchase.  At this point, I repositioned distraction pins from C7 into C5, repositioned the retractors, and using the same technique I used at C6- 7, I performed a 5-6 ACDF.  I used the same size graft as well.  At this point with both grafts in position, I then took a 32-mm anterior cervical plate and I removed the distraction pins, irrigated the wound copiously with normal saline.  I then applied the plate and secured it with 14 mm screws.  I had excellent purchase with all 6 screws.  The locking mechanisms were secured per manufacturer standards.  I irrigated copiously again and then checked to ensure the esophagus was not entrapped beneath the plate inadvertently.  Once the trachea and esophagus were returned back to midline, I then closed the platysma with interrupted 2-0 Vicryl sutures and 3-0 Monocryl for the skin.  Steri- Strips, dry dressing, and Aspen collar were applied.  The patient was extubated and transferred to the PACU without incident.  At the end of the case, all needle and sponge counts were correct.     Alvy Beal, MD     DDB/MEDQ  D:  08/17/2013  T:  08/18/2013  Job:  [id number]

## 2013-08-22 ENCOUNTER — Encounter (HOSPITAL_COMMUNITY): Payer: Self-pay | Admitting: Orthopedic Surgery

## 2013-09-14 NOTE — Discharge Summary (Signed)
  ABBREVIATED DISCHARGE SUMMARY      DATE OF HOSPITALIZATION:  17 Aug 2013  REASON FOR HOSPITALIZATION:  52 yo male with hx of C5-7 spondylosis, neck pain.  Failed conservative tx.     SIGNIFICANT FINDINGS:  Cervical spondylosis, ddd  OPERATION: C5-7 ACDF  FINAL DIAGNOSIS:  same  SECONDARY DIAGNOSIS: none  CONSULTANTS:  none  DISCHARGE CONDITION:  STABLE  DISCHARGED TO:  HOME

## 2013-09-14 NOTE — Discharge Summary (Signed)
Agree with above 

## 2014-01-12 ENCOUNTER — Encounter (HOSPITAL_BASED_OUTPATIENT_CLINIC_OR_DEPARTMENT_OTHER): Payer: Self-pay | Admitting: Emergency Medicine

## 2014-01-12 ENCOUNTER — Emergency Department (HOSPITAL_BASED_OUTPATIENT_CLINIC_OR_DEPARTMENT_OTHER)
Admission: EM | Admit: 2014-01-12 | Discharge: 2014-01-12 | Disposition: A | Discharge status: Home / Self Care | Payer: Managed Care, Other (non HMO) | Attending: Emergency Medicine | Admitting: Emergency Medicine

## 2014-01-12 DIAGNOSIS — I10 Essential (primary) hypertension: Secondary | ICD-10-CM | POA: Insufficient documentation

## 2014-01-12 DIAGNOSIS — Z79899 Other long term (current) drug therapy: Secondary | ICD-10-CM | POA: Insufficient documentation

## 2014-01-12 DIAGNOSIS — J45901 Unspecified asthma with (acute) exacerbation: Secondary | ICD-10-CM | POA: Insufficient documentation

## 2014-01-12 DIAGNOSIS — J45909 Unspecified asthma, uncomplicated: Secondary | ICD-10-CM

## 2014-01-12 MED ORDER — PREDNISONE 50 MG PO TABS
60.0000 mg | ORAL_TABLET | Freq: Every day | ORAL | Status: DC
  Administered 2014-01-12: 60 mg via ORAL
  Filled 2014-01-12 (×2): qty 1

## 2014-01-12 MED ORDER — ALBUTEROL SULFATE (2.5 MG/3ML) 0.083% IN NEBU
2.5000 mg | INHALATION_SOLUTION | Freq: Once | RESPIRATORY_TRACT | Status: AC
  Administered 2014-01-12: 2.5 mg via RESPIRATORY_TRACT
  Filled 2014-01-12: qty 3

## 2014-01-12 MED ORDER — IPRATROPIUM-ALBUTEROL 0.5-2.5 (3) MG/3ML IN SOLN
3.0000 mL | Freq: Once | RESPIRATORY_TRACT | Status: AC
  Administered 2014-01-12: 3 mL via RESPIRATORY_TRACT
  Filled 2014-01-12: qty 3

## 2014-01-12 MED ORDER — PREDNISONE 10 MG PO TABS: ORAL_TABLET | ORAL | Status: DC

## 2014-01-12 NOTE — Discharge Instructions (Signed)
Asthma, Adult Asthma is a recurring condition in which the airways tighten and narrow. Asthma can make it difficult to breathe. It can cause coughing, wheezing, and shortness of breath. Asthma episodes (also called asthma attacks) range from minor to life-threatening. Asthma cannot be cured, but medicines and lifestyle changes can help control it. CAUSES Asthma is believed to be caused by inherited (genetic) and environmental factors, but its exact cause is unknown. Asthma may be triggered by allergens, lung infections, or irritants in the air. Asthma triggers are different for each person. Common triggers include:   Animal dander.  Dust mites.  Cockroaches.  Pollen from trees or grass.  Mold.  Smoke.  Air pollutants such as dust, household cleaners, hair sprays, aerosol sprays, paint fumes, strong chemicals, or strong odors.  Cold air, weather changes, and winds (which increase molds and pollens in the air).  Strong emotional expressions such as crying or laughing hard.  Stress.  Certain medicines (such as aspirin) or types of drugs (such as beta-blockers).  Sulfites in foods and drinks. Foods and drinks that may contain sulfites include dried fruit, potato chips, and sparkling grape juice.  Infections or inflammatory conditions such as the flu, a cold, or an inflammation of the nasal membranes (rhinitis).  Gastroesophageal reflux disease (GERD).  Exercise or strenuous activity. SYMPTOMS Symptoms may occur immediately after asthma is triggered or many hours later. Symptoms include:  Wheezing.  Excessive nighttime or early morning coughing.  Frequent or severe coughing with a common cold.  Chest tightness.  Shortness of breath. DIAGNOSIS  The diagnosis of asthma is made by a review of your medical history and a physical exam. Tests may also be performed. These may include:  Lung function studies. These tests show how much air you breath in and out.  Allergy  tests.  Imaging tests such as X-rays. TREATMENT  Asthma cannot be cured, but it can usually be controlled. Treatment involves identifying and avoiding your asthma triggers. It also involves medicines. There are 2 classes of medicine used for asthma treatment:   Controller medicines. These prevent asthma symptoms from occurring. They are usually taken every day.  Reliever or rescue medicines. These quickly relieve asthma symptoms. They are used as needed and provide short-term relief. Your health care provider will help you create an asthma action plan. An asthma action plan is a written plan for managing and treating your asthma attacks. It includes a list of your asthma triggers and how they may be avoided. It also includes information on when medicines should be taken and when their dosage should be changed. An action plan may also involve the use of a device called a peak flow meter. A peak flow meter measures how well the lungs are working. It helps you monitor your condition. HOME CARE INSTRUCTIONS   Take medicine as directed by your health care provider. Speak with your health care provider if you have questions about how or when to take the medicines.  Use a peak flow meter as directed by your health care provider. Record and keep track of readings.  Understand and use the action plan to help minimize or stop an asthma attack without needing to seek medical care.  Control your home environment in the following ways to help prevent asthma attacks:  Do not smoke. Avoid being exposed to secondhand smoke.  Change your heating and air conditioning filter regularly.  Limit your use of fireplaces and wood stoves.  Get rid of pests (such as roaches and   mice) and their droppings.  Throw away plants if you see mold on them.  Clean your floors and dust regularly. Use unscented cleaning products.  Try to have someone else vacuum for you regularly. Stay out of rooms while they are being  vacuumed and for a short while afterward. If you vacuum, use a dust mask from a hardware store, a double-layered or microfilter vacuum cleaner bag, or a vacuum cleaner with a HEPA filter.  Replace carpet with wood, tile, or vinyl flooring. Carpet can trap dander and dust.  Use allergy-proof pillows, mattress covers, and box spring covers.  Wash bed sheets and blankets every week in hot water and dry them in a dryer.  Use blankets that are made of polyester or cotton.  Clean bathrooms and kitchens with bleach. If possible, have someone repaint the walls in these rooms with mold-resistant paint. Keep out of the rooms that are being cleaned and painted.  Wash hands frequently. SEEK MEDICAL CARE IF:   You have wheezing, shortness of breath, or a cough even if taking medicine to prevent attacks.  The colored mucus you cough up (sputum) is thicker than usual.  Your sputum changes from clear or white to yellow, green, gray, or bloody.  You have any problems that may be related to the medicines you are taking (such as a rash, itching, swelling, or trouble breathing).  You are using a reliever medicine more than 2 3 times per week.  Your peak flow is still at 50 79% of you personal best after following your action plan for 1 hour. SEEK IMMEDIATE MEDICAL CARE IF:   You seem to be getting worse and are unresponsive to treatment during an asthma attack.  You are short of breath even at rest.  You get short of breath when doing very little physical activity.  You have difficulty eating, drinking, or talking due to asthma symptoms.  You develop chest pain.  You develop a fast heartbeat.  You have a bluish color to your lips or fingernails.  You are lightheaded, dizzy, or faint.  Your peak flow is less than 50% of your personal best.  You have a fever or persistent symptoms for more than 2 3 days.  You have a fever and symptoms suddenly get worse. MAKE SURE YOU:   Understand these  instructions.  Will watch your condition.  Will get help right away if you are not doing well or get worse. Document Released: 09/14/2005 Document Revised: 05/17/2013 Document Reviewed: 04/13/2013 ExitCare Patient Information 2014 [employer].  

## 2014-01-12 NOTE — ED Notes (Signed)
C/o cough, SOB x 3 weeks-states proair inhaler helping-NAD at this time

## 2014-01-12 NOTE — ED Provider Notes (Signed)
CSN: [id number]     Arrival date & time 01/12/14  1907 History   First MD Initiated Contact with Patient 01/12/14 1937     Chief Complaint  Patient presents with  . Cough     (Consider location/radiation/quality/duration/timing/severity/associated sxs/prior Treatment) Patient is a 53 y.o. male presenting with cough. The history is provided by the patient. No language interpreter was used.  Cough Cough characteristics:  Non-productive Severity:  Severe Onset quality:  Gradual Duration:  3 weeks Timing:  Constant Progression:  Worsening Chronicity:  New Smoker: no   Relieved by:  Beta-agonist inhaler Worsened by:  Nothing tried Ineffective treatments:  None tried Associated symptoms: shortness of breath     Past Medical History  Diagnosis Date  . Seasonal allergies   . Hypertension   . Asthma     as a child  . Headache(784.0)     due to neck problems   Past Surgical History  Procedure Laterality Date  . Rotor cuff    . Appendectomy    . Tonsillectomy    . Tumor removal Left     benign tumor over left eye  . Anterior cervical decomp/discectomy fusion N/A 08/17/2013    Procedure: ANTERIOR CERVICAL DECOMPRESSION/DISCECTOMY FUSION 2 LEVELS C5-7;  Surgeon: Venita Lickahari Brooks, MD;  Location: [facility] OR;  Service: Orthopedics;  Laterality: N/A;   No family history on file. History  Substance Use Topics  . Smoking status: Never Smoker   . Smokeless tobacco: Never Used  . Alcohol Use: Yes     Comment: occasionally    Review of Systems  Respiratory: Positive for cough and shortness of breath.   All other systems reviewed and are negative.     Allergies  Review of patient's allergies indicates no known allergies.  Home Medications   Prior to Admission medications   Medication Sig Start Date End Date Taking? Authorizing Provider  Cetirizine HCl (ZYRTEC PO) Take by mouth.   Yes Historical Provider, MD  albuterol (PROVENTIL HFA;VENTOLIN HFA) 108 (90 BASE) MCG/ACT inhaler  Inhale 1-2 puffs into the lungs every 6 (six) hours as needed for wheezing or shortness of breath.    Historical Provider, MD  docusate sodium (COLACE) 100 MG capsule Take 1 capsule (100 mg total) by mouth 2 (two) times daily. 08/18/13   Zonia KiefJames Owens, PA-C  hydrochlorothiazide (MICROZIDE) 12.5 MG capsule Take 12.5 mg by mouth daily.    Historical Provider, MD  methocarbamol (ROBAXIN) 500 MG tablet Take 1 tablet (500 mg total) by mouth every 6 (six) hours as needed for muscle spasms. 08/18/13   Zonia KiefJames Owens, PA-C  ondansetron (ZOFRAN ODT) 4 MG disintegrating tablet Take 1 tablet (4 mg total) by mouth every 8 (eight) hours as needed for nausea or vomiting. 08/18/13   Zonia KiefJames Owens, PA-C  oxyCODONE-acetaminophen (PERCOCET/ROXICET) 5-325 MG per tablet Take 1-2 tablets by mouth every 4 (four) hours as needed for severe pain. 08/18/13   Zonia KiefJames Owens, PA-C  polyethylene glycol (MIRALAX / GLYCOLAX) packet Take 17 g by mouth daily. 08/18/13   Zonia KiefJames Owens, PA-C   BP 142/92  Pulse 91  Temp(Src) 98.5 F (36.9 C) (Oral)  Resp 20  Ht 6\' 2"  (1.88 m)  Wt 192 lb (87.091 kg)  BMI 24.64 kg/m2  SpO2 98% Physical Exam  Nursing note and vitals reviewed. Constitutional: He is oriented to person, place, and time. He appears well-developed and well-nourished.  HENT:  Head: Normocephalic.  Eyes: Pupils are equal, round, and reactive to light.  Neck: Normal range of  motion.  Cardiovascular: Normal rate.   Pulmonary/Chest: He has wheezes.  Abdominal: Soft.  Musculoskeletal: Normal range of motion.  Neurological: He is alert and oriented to person, place, and time.  Skin: Skin is warm.  Psychiatric: He has a normal mood and affect.    ED Course  Procedures (including critical care time) Labs Review Labs Reviewed - No data to display  Imaging Review No results found.   EKG Interpretation None      MDM   Final diagnoses:  Asthma    Pt given albuterol and atrovent   Pt feels better, Lungs  clear.    Lonia SkinnerLeslie K HarrellsvilleSofia, PA-C 01/12/14 2016

## 2014-01-12 NOTE — ED Notes (Signed)
C/o cough, sob x 3 weeks,  States allergies are acting up along with a cold

## 2014-01-12 NOTE — ED Provider Notes (Signed)
Medical screening examination/treatment/procedure(s) were performed by non-physician practitioner and as supervising physician I was immediately available for consultation/collaboration.   EKG Interpretation None        William Ethan Kasperski, MD 01/12/14 2035 

## 2014-02-16 ENCOUNTER — Encounter (HOSPITAL_COMMUNITY): Payer: Self-pay | Admitting: Emergency Medicine

## 2014-02-16 ENCOUNTER — Emergency Department (HOSPITAL_COMMUNITY)
Admission: EM | Admit: 2014-02-16 | Discharge: 2014-02-16 | Disposition: A | Discharge status: Home / Self Care | Payer: Managed Care, Other (non HMO) | Attending: Emergency Medicine | Admitting: Emergency Medicine

## 2014-02-16 DIAGNOSIS — Y9389 Activity, other specified: Secondary | ICD-10-CM | POA: Insufficient documentation

## 2014-02-16 DIAGNOSIS — Y92410 Unspecified street and highway as the place of occurrence of the external cause: Secondary | ICD-10-CM | POA: Diagnosis not present

## 2014-02-16 DIAGNOSIS — IMO0002 Reserved for concepts with insufficient information to code with codable children: Secondary | ICD-10-CM | POA: Insufficient documentation

## 2014-02-16 DIAGNOSIS — S46909A Unspecified injury of unspecified muscle, fascia and tendon at shoulder and upper arm level, unspecified arm, initial encounter: Secondary | ICD-10-CM | POA: Insufficient documentation

## 2014-02-16 DIAGNOSIS — S199XXA Unspecified injury of neck, initial encounter: Principal | ICD-10-CM

## 2014-02-16 DIAGNOSIS — Z23 Encounter for immunization: Secondary | ICD-10-CM | POA: Insufficient documentation

## 2014-02-16 DIAGNOSIS — I10 Essential (primary) hypertension: Secondary | ICD-10-CM | POA: Insufficient documentation

## 2014-02-16 DIAGNOSIS — S50819A Abrasion of unspecified forearm, initial encounter: Secondary | ICD-10-CM

## 2014-02-16 DIAGNOSIS — Z79899 Other long term (current) drug therapy: Secondary | ICD-10-CM | POA: Diagnosis not present

## 2014-02-16 DIAGNOSIS — S0993XA Unspecified injury of face, initial encounter: Secondary | ICD-10-CM | POA: Insufficient documentation

## 2014-02-16 DIAGNOSIS — J45909 Unspecified asthma, uncomplicated: Secondary | ICD-10-CM | POA: Insufficient documentation

## 2014-02-16 DIAGNOSIS — S4980XA Other specified injuries of shoulder and upper arm, unspecified arm, initial encounter: Secondary | ICD-10-CM | POA: Insufficient documentation

## 2014-02-16 MED ORDER — TETANUS-DIPHTH-ACELL PERTUSSIS 5-2.5-18.5 LF-MCG/0.5 IM SUSP
0.5000 mL | Freq: Once | INTRAMUSCULAR | Status: AC
Start: 2014-02-16 — End: 2014-02-16
  Administered 2014-02-16: 0.5 mL via INTRAMUSCULAR
  Filled 2014-02-16: qty 0.5
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
## 2014-02-17 NOTE — ED Provider Notes (Signed)
Medical screening examination/treatment/procedure(s) were performed by non-physician practitioner and as supervising physician I was immediately available for consultation/collaboration.   EKG Interpretation None        Junius Argyle, MD 02/17/14 1227

## 2018-12-06 ENCOUNTER — Emergency Department (INDEPENDENT_AMBULATORY_CARE_PROVIDER_SITE_OTHER)
Admission: EM | Admit: 2018-12-06 | Discharge: 2018-12-06 | Disposition: A | Discharge status: Home / Self Care | Payer: 59 | Source: Home / Self Care | Attending: Family Medicine | Admitting: Family Medicine

## 2018-12-06 ENCOUNTER — Other Ambulatory Visit: Payer: Self-pay

## 2018-12-06 DIAGNOSIS — J069 Acute upper respiratory infection, unspecified: Secondary | ICD-10-CM | POA: Diagnosis not present

## 2018-12-06 DIAGNOSIS — J9801 Acute bronchospasm: Secondary | ICD-10-CM

## 2018-12-06 DIAGNOSIS — B9789 Other viral agents as the cause of diseases classified elsewhere: Secondary | ICD-10-CM

## 2018-12-06 LAB — POCT INFLUENZA A/B
INFLUENZA A, POC: NEGATIVE
Influenza B, POC: NEGATIVE

## 2018-12-06 MED ORDER — METHYLPREDNISOLONE SODIUM SUCC 125 MG IJ SOLR
80.0000 mg | Freq: Once | INTRAMUSCULAR | Status: AC
  Administered 2018-12-06: 80 mg via INTRAMUSCULAR

## 2018-12-06 MED ORDER — DOXYCYCLINE HYCLATE 100 MG PO CAPS: 100.0000 mg | ORAL_CAPSULE | Freq: Two times a day (BID) | ORAL | 0 refills | Status: DC

## 2018-12-06 MED ORDER — IPRATROPIUM-ALBUTEROL 0.5-2.5 (3) MG/3ML IN SOLN
3.0000 mL | Freq: Once | RESPIRATORY_TRACT | Status: AC
  Administered 2018-12-06: 3 mL via RESPIRATORY_TRACT

## 2018-12-06 MED ORDER — BENZONATATE 200 MG PO CAPS: ORAL_CAPSULE | ORAL | 0 refills | Status: DC

## 2018-12-06 MED ORDER — ALBUTEROL SULFATE HFA 108 (90 BASE) MCG/ACT IN AERS: 1.0000 | INHALATION_SPRAY | Freq: Four times a day (QID) | RESPIRATORY_TRACT | 0 refills | Status: DC | PRN

## 2018-12-06 MED ORDER — PREDNISONE 20 MG PO TABS: ORAL_TABLET | ORAL | 0 refills | Status: DC

## 2018-12-06 NOTE — ED Provider Notes (Signed)
[facility]    CSN: [id number] Arrival date & time: 12/06/18  1346     History   Chief Complaint Chief Complaint  Patient presents with  . Nasal Congestion  . Cough    HPI Jordan Kaiser is a 58 y.o. male.   Patient reports that he began to feel fatigued about 4 days ago.  Two days ago he developed a sore throat, headache, and tightness in his chest.  Yesterday he developed a cough and low grade fever.  He had asthma during childhood, and his present albuterol inhaler has been helpful.  He has a history of springtime seasonal rhinitis, and family history of asthma (father).  The history is provided by the patient.    Past Medical History:  Diagnosis Date  . Asthma    as a child  . Headache(784.0)    due to neck problems  . Hypertension   . Seasonal allergies     There are no active problems to display for this patient.   Past Surgical History:  Procedure Laterality Date  . ANTERIOR CERVICAL DECOMP/DISCECTOMY FUSION N/A 08/17/2013   Procedure: ANTERIOR CERVICAL DECOMPRESSION/DISCECTOMY FUSION 2 LEVELS C5-7;  Surgeon: Venita Lick, MD;  Location: [facility] OR;  Service: Orthopedics;  Laterality: N/A;  . APPENDECTOMY    . rotor cuff    . TONSILLECTOMY    . TUMOR REMOVAL Left    benign tumor over left eye       Home Medications    Prior to Admission medications   Medication Sig Start Date End Date Taking? Authorizing Provider  lisinopril (PRINIVIL,ZESTRIL) 10 MG tablet Take 10 mg by mouth daily.   Yes [provider]  albuterol (PROVENTIL HFA;VENTOLIN HFA) 108 (90 Base) MCG/ACT inhaler Inhale 1-2 puffs into the lungs every 6 (six) hours as needed for wheezing or shortness of breath. 12/06/18   Lattie Haw, MD  benzonatate (TESSALON) 200 MG capsule Take one cap by mouth at bedtime as needed for cough.  May repeat in 4 to 6 hours 12/06/18   Lattie Haw, MD  doxycycline (VIBRAMYCIN) 100 MG capsule Take 1 capsule (100 mg total) by mouth 2  (two) times daily. Take with food (Rx void after 12/14/18) 12/06/18   Lattie Haw, MD  hydrochlorothiazide (MICROZIDE) 12.5 MG capsule Take 12.5 mg by mouth daily.    [provider]  oxyCODONE-acetaminophen (PERCOCET/ROXICET) 5-325 MG per tablet Take 1-2 tablets by mouth every 4 (four) hours as needed for severe pain. 08/18/13   Naida Sleight, PA-C  predniSONE (DELTASONE) 20 MG tablet Take one tab by mouth twice daily for 4 days, then one daily for 3 days. Take with food. 12/06/18   Lattie Haw, MD    Family History No family history on file.  Social History Social History   Tobacco Use  . Smoking status: Never Smoker  . Smokeless tobacco: Never Used  Substance Use Topics  . Alcohol use: Yes    Comment: occasionally  . Drug use: No     Allergies   Patient has no known allergies.   Review of Systems Review of Systems + sore throat + cough No pleuritic pain + wheezing + nasal congestion + post-nasal drainage No sinus pain/pressure No itchy/red eyes ? earache No hemoptysis No SOB + fever, + chills No nausea No vomiting No abdominal pain No diarrhea No urinary symptoms No skin rash + fatigue No myalgias + headache Used OTC meds without relief   Physical Exam  Triage Vital Signs ED Triage Vitals  Enc Vitals Group     BP 12/06/18 1401 121/78     Pulse Rate 12/06/18 1401 95     Resp 12/06/18 1401 20     Temp 12/06/18 1401 99.3 F (37.4 C)     Temp Source 12/06/18 1401 Oral     SpO2 12/06/18 1401 99 %     Weight 12/06/18 1402 190 lb (86.2 kg)     Height 12/06/18 1402 6\' 2"  (1.88 m)     Head Circumference --      Peak Flow --      Pain Score 12/06/18 1402 0     Pain Loc --      Pain Edu? --      Excl. in GC? --    No data found.  Updated Vital Signs BP 121/78 (BP Location: Right Arm)   Pulse 95   Temp 99.3 F (37.4 C) (Oral)   Resp 20   Ht 6\' 2"  (1.88 m)   Wt 86.2 kg   SpO2 99%   BMI 24.39 kg/m   Visual Acuity Right Eye  Distance:   Left Eye Distance:   Bilateral Distance:    Right Eye Near:   Left Eye Near:    Bilateral Near:     Physical Exam Nursing notes and Vital Signs reviewed. Appearance:  Patient appears stated age, and in no acute distress Eyes:  Pupils are equal, round, and reactive to light and accomodation.  Extraocular movement is intact.  Conjunctivae are not inflamed  Ears:  Canals normal.  Tympanic membranes normal.  Nose:  Mildly congested turbinates.  No sinus tenderness.  Pharynx:  Normal Neck:  Supple.  Enlarged posterior/lateral nodes are palpated bilaterally, tender to palpation on the left.   Lungs:  Faint bibasilar wheezes present.  Breath sounds are equal.  Moving air well.  Post neb treatment:  Decreased wheezes heard. Heart:  Regular rate and rhythm without murmurs, rubs, or gallops.  Abdomen:  Nontender without masses or hepatosplenomegaly.  Bowel sounds are present.  No CVA or flank tenderness.  Extremities:  No edema.  Skin:  No rash present.    UC Treatments / Results  Labs (all labs ordered are listed, but only abnormal results are displayed) Labs Reviewed  POCT INFLUENZA A/B negative    EKG None  Radiology No results found.  Procedures Procedures (including critical care time)  Medications Ordered in UC Medications  methylPREDNISolone sodium succinate (SOLU-MEDROL) 125 mg/2 mL injection 80 mg (has no administration in time range)  ipratropium-albuterol (DUONEB) 0.5-2.5 (3) MG/3ML nebulizer solution 3 mL (3 mLs Nebulization Given 12/06/18 1453)    Initial Impression / Assessment and Plan / UC Course  I have reviewed the triage vital signs and the nursing notes.  Pertinent labs & imaging results that were available during my care of the patient were reviewed by me and considered in my medical decision making (see chart for details).    There is no evidence of bacterial infection today.   Administered Solumedrol 80mg  IM, then begin prednisone  burst/taper. Prescription written for Benzonatate [facility]) to take at bedtime for night-time cough.  Rx written for albuterol inhaler. Followup with Family Doctor if not improved in about 10 days.   Final Clinical Impressions(s) / UC Diagnoses   Final diagnoses:  Viral URI with cough  Bronchospasm, acute     Discharge Instructions     Begin prednisone 12/07/18. Take plain guaifenesin (1200mg  extended release tabs such  as Mucinex) twice daily, with plenty of water, for cough and congestion.   Get adequate rest.   May use Afrin nasal spray (or generic oxymetazoline) each morning for about 5 days and then discontinue.  Also recommend using saline nasal spray several times daily and saline nasal irrigation (AYR is a common brand).  Use Flonase nasal spray each morning after using Afrin nasal spray and saline nasal irrigation. Try warm salt water gargles for sore throat.  Stop all antihistamines for now, and other non-prescription cough/cold preparations. May take Tylenol as needed for chest pain, fever, headache, etc. May take Delsym Cough Suppressant with Tessalon at bedtime for nighttime cough.  Begin doxycycline if not improving about one week or if persistent fever develops  (Given a prescription to hold, with an expiration date)      ED Prescriptions    Medication Sig Dispense Auth. Provider   albuterol (PROVENTIL HFA;VENTOLIN HFA) 108 (90 Base) MCG/ACT inhaler Inhale 1-2 puffs into the lungs every 6 (six) hours as needed for wheezing or shortness of breath. 1 Inhaler Lattie Haw, MD   predniSONE (DELTASONE) 20 MG tablet Take one tab by mouth twice daily for 4 days, then one daily for 3 days. Take with food. 11 tablet Lattie Haw, MD   benzonatate (TESSALON) 200 MG capsule Take one cap by mouth at bedtime as needed for cough.  May repeat in 4 to 6 hours 15 capsule Lattie Haw, MD   doxycycline (VIBRAMYCIN) 100 MG capsule Take 1 capsule (100 mg total) by mouth 2 (two)  times daily. Take with food (Rx void after 12/14/18) 14 capsule Lattie Haw, MD        Lattie Haw, MD 12/12/18 [id number]

## 2018-12-06 NOTE — Discharge Instructions (Addendum)
Begin prednisone 12/07/18. Take plain guaifenesin (1200mg  extended release tabs such as Mucinex) twice daily, with plenty of water, for cough and congestion.   Get adequate rest.   May use Afrin nasal spray (or generic oxymetazoline) each morning for about 5 days and then discontinue.  Also recommend using saline nasal spray several times daily and saline nasal irrigation (AYR is a common brand).  Use Flonase nasal spray each morning after using Afrin nasal spray and saline nasal irrigation. Try warm salt water gargles for sore throat.  Stop all antihistamines for now, and other non-prescription cough/cold preparations. May take Tylenol as needed for chest pain, fever, headache, etc. May take Delsym Cough Suppressant with Tessalon at bedtime for nighttime cough.  Begin doxycycline if not improving about one week or if persistent fever develops

## 2018-12-06 NOTE — ED Triage Notes (Signed)
Unable to sleep for 2 days, allergies, scratchy throat, feels like it is in his chest, Nasal drainage, headaches.  Productive cough, clear.

## 2018-12-10 ENCOUNTER — Telehealth: Payer: Self-pay | Admitting: Emergency Medicine

## 2018-12-10 NOTE — Telephone Encounter (Signed)
Patient states not improving; does not know if he has a fever; did not fill rx for antibiotic since it was to be held for worsening symptoms. Suggested he fill rx; and start; get thermometer; follow all other AVS suggestions; be in touch if no improvement or worsening. He understands and will comply.

## 2018-12-12 NOTE — Telephone Encounter (Signed)
Pt called and asked for an extended work note, because work does not want him back until sx are resolved.  Dr Georgina Pillion oked work note through this week.  Written, and wife will pick up.

## 2018-12-16 ENCOUNTER — Other Ambulatory Visit: Payer: Self-pay

## 2018-12-16 ENCOUNTER — Emergency Department (INDEPENDENT_AMBULATORY_CARE_PROVIDER_SITE_OTHER): Payer: 59

## 2018-12-16 ENCOUNTER — Encounter: Payer: Self-pay | Admitting: Family Medicine

## 2018-12-16 ENCOUNTER — Emergency Department (INDEPENDENT_AMBULATORY_CARE_PROVIDER_SITE_OTHER)
Admission: EM | Admit: 2018-12-16 | Discharge: 2018-12-16 | Disposition: A | Discharge status: Home / Self Care | Payer: 59 | Source: Home / Self Care

## 2018-12-16 DIAGNOSIS — R079 Chest pain, unspecified: Secondary | ICD-10-CM

## 2018-12-16 DIAGNOSIS — J40 Bronchitis, not specified as acute or chronic: Secondary | ICD-10-CM

## 2018-12-16 MED ORDER — OMEPRAZOLE 20 MG PO CPDR: 20.0000 mg | DELAYED_RELEASE_CAPSULE | Freq: Every day | ORAL | 1 refills | Status: DC

## 2018-12-16 MED ORDER — ALUM & MAG HYDROXIDE-SIMETH 200-200-20 MG/5ML PO SUSP
15.0000 mL | Freq: Once | ORAL | Status: AC
  Administered 2018-12-16: 15 mL via ORAL

## 2018-12-16 NOTE — ED Provider Notes (Addendum)
[facility]    CSN: [id number] Arrival date & time: 12/16/18  0804     History   Chief Complaint Chief Complaint  Patient presents with  . Cough    Chest congestion/tightness    HPI Jordan Kaiser is a 58 y.o. male.   Patient here in follow up from visit ten days ago when he was diagnosed with bronchospasm and viral URI with cough.  He has a h/o asthma as a child as well as ongoing seasonal allergies.  He was given a prescription for prednisone, and brings in a "return to work" paper to be filled out.  The pain is constant and substernal, described as a pressure.  It is worse when lying down at night.  No symptoms of reflux.  No shortness of breath or cough.  He feels better after using his pro-Air and the prednisone seemed to help as well.  He has been fairly sedentary, but has not noted any exertional worsening of symptoms.  No diaphoresis or shortness of breath.  Patient is treated for hypertension with lisinopril.  No dizziness.  Significantly, his father died of sudden MI at age 74.     Note from prior visit(12/06/2018): Patient reports that he began to feel fatigued about 4 days ago.  Two days ago he developed a sore throat, headache, and tightness in his chest.  Yesterday he developed a cough and low grade fever.  He had asthma during childhood, and his present albuterol inhaler has been helpful.  He has a history of springtime seasonal rhinitis, and family history of asthma (father).  Patient works for [employer]     Past Medical History:  Diagnosis Date  . Asthma    as a child  . Headache(784.0)    due to neck problems  . Hypertension   . Seasonal allergies     There are no active problems to display for this patient.   Past Surgical History:  Procedure Laterality Date  . ANTERIOR CERVICAL DECOMP/DISCECTOMY FUSION N/A 08/17/2013   Procedure: ANTERIOR CERVICAL DECOMPRESSION/DISCECTOMY FUSION 2 LEVELS C5-7;  Surgeon: Venita Lick, MD;   Location: [facility] OR;  Service: Orthopedics;  Laterality: N/A;  . APPENDECTOMY    . rotor cuff    . TONSILLECTOMY    . TUMOR REMOVAL Left    benign tumor over left eye       Home Medications    Prior to Admission medications   Medication Sig Start Date End Date Taking? Authorizing Provider  albuterol (PROVENTIL HFA;VENTOLIN HFA) 108 (90 Base) MCG/ACT inhaler Inhale 1-2 puffs into the lungs every 6 (six) hours as needed for wheezing or shortness of breath. 12/06/18   Lattie Haw, MD  benzonatate (TESSALON) 200 MG capsule Take one cap by mouth at bedtime as needed for cough.  May repeat in 4 to 6 hours 12/06/18   Lattie Haw, MD  doxycycline (VIBRAMYCIN) 100 MG capsule Take 1 capsule (100 mg total) by mouth 2 (two) times daily. Take with food (Rx void after 12/14/18) 12/06/18   Lattie Haw, MD  hydrochlorothiazide (MICROZIDE) 12.5 MG capsule Take 12.5 mg by mouth daily.    [provider]  lisinopril (PRINIVIL,ZESTRIL) 10 MG tablet Take 10 mg by mouth daily.    [provider]  omeprazole (PRILOSEC) 20 MG capsule Take 1 capsule (20 mg total) by mouth daily. 12/16/18   Elvina Sidle, MD  oxyCODONE-acetaminophen (PERCOCET/ROXICET) 5-325 MG per tablet Take 1-2 tablets by mouth every 4 (  four) hours as needed for severe pain. 08/18/13   Naida Sleight, PA-C  predniSONE (DELTASONE) 20 MG tablet Take one tab by mouth twice daily for 4 days, then one daily for 3 days. Take with food. 12/06/18   Lattie Haw, MD    Family History History reviewed. No pertinent family history.  Social History Social History   Tobacco Use  . Smoking status: Never Smoker  . Smokeless tobacco: Never Used  Substance Use Topics  . Alcohol use: Yes    Comment: occasionally  . Drug use: No     Allergies   Patient has no known allergies.   Review of Systems Review of Systems   Physical Exam Triage Vital Signs ED Triage Vitals  Enc Vitals Group     BP      Pulse       Resp      Temp      Temp src      SpO2      Weight      Height      Head Circumference      Peak Flow      Pain Score      Pain Loc      Pain Edu?      Excl. in GC?    No data found.  Updated Vital Signs BP 118/83 (BP Location: Right Arm)   Pulse 79   Temp 98.2 F (36.8 C) (Oral)   Resp 18   Ht 6\' 2"  (1.88 m)   Wt 87.1 kg   SpO2 97%   BMI 24.65 kg/m    Physical Exam Vitals signs and nursing note reviewed.  Constitutional:      Appearance: Normal appearance. He is normal weight.  HENT:     Head: Normocephalic.     Mouth/Throat:     Mouth: Mucous membranes are moist.     Pharynx: Oropharynx is clear.  Eyes:     Extraocular Movements: Extraocular movements intact.     Conjunctiva/sclera: Conjunctivae normal.     Pupils: Pupils are equal, round, and reactive to light.  Neck:     Musculoskeletal: Normal range of motion and neck supple. No muscular tenderness.     Vascular: No carotid bruit.     Comments: 2 cm antero-lateral cervical surgical scar Cardiovascular:     Rate and Rhythm: Normal rate and regular rhythm.     Heart sounds: Normal heart sounds.  Pulmonary:     Effort: Pulmonary effort is normal.     Breath sounds: Normal breath sounds.  Musculoskeletal: Normal range of motion.  Skin:    General: Skin is warm and dry.  Neurological:     General: No focal deficit present.     Mental Status: He is alert and oriented to person, place, and time.     Cranial Nerves: No cranial nerve deficit.     Gait: Gait normal.  Psychiatric:        Mood and Affect: Mood normal.        Behavior: Behavior normal.        Thought Content: Thought content normal.        Judgment: Judgment normal.      UC Treatments / Results  Labs (all labs ordered are listed, but only abnormal results are displayed) Labs Reviewed - No data to display  EKG 12 lead EKG:  Normal sinus rhythm  Radiology Chest x-ray:  No infiltrates   Procedures Given 15 ml of Mylanta  and pain  is noticeably decreased    Initial Impression / Assessment and Plan / UC Course  I have reviewed the triage vital signs and the nursing notes.  Pertinent labs & imaging results that were available during my care of the patient were reviewed by me and considered in my medical decision making (see chart for details).     Final Clinical Impressions(s) / UC Diagnoses   Final diagnoses:  Bronchitis  Chest pain, unspecified type     Discharge Instructions     Continue the Mylanta as needed  Given your family history of heart disease and sudden death with your father, I recommend you have a precautionary consultation with a cardiologist.  Your chest x-ray and EKG are normal    ED Prescriptions    Medication Sig Dispense Auth. Provider   omeprazole (PRILOSEC) 20 MG capsule Take 1 capsule (20 mg total) by mouth daily. 10 capsule Elvina Sidle, MD     Controlled Substance Prescriptions [place] Controlled Substance Registry consulted? Not Applicable   Elvina Sidle, MD 12/16/18 6553    Elvina Sidle, MD 12/16/18 [phone]

## 2018-12-16 NOTE — ED Triage Notes (Signed)
Pt c/o cold sxs from last week. Lingering cough, some congestion. Almost feels like indigestion but worse when he lays down.

## 2018-12-16 NOTE — Discharge Instructions (Addendum)
Continue the Mylanta as needed  Given your family history of heart disease and sudden death with your father, I recommend you have a precautionary consultation with a cardiologist.  Your chest x-ray and EKG are normal

## 2019-04-09 ENCOUNTER — Emergency Department
Admission: EM | Admit: 2019-04-09 | Discharge: 2019-04-09 | Discharge status: Absent without leave | Payer: 59 | Source: Home / Self Care

## 2019-04-09 ENCOUNTER — Other Ambulatory Visit: Payer: Self-pay

## 2019-10-16 IMAGING — DX CHEST - 2 VIEW
2 series · 2 of 2 positions shown · non-contrast
Comparison: August 16, 2013

CLINICAL DATA: Chest pain

EXAM:
CHEST - 2 VIEW

[chest pa]
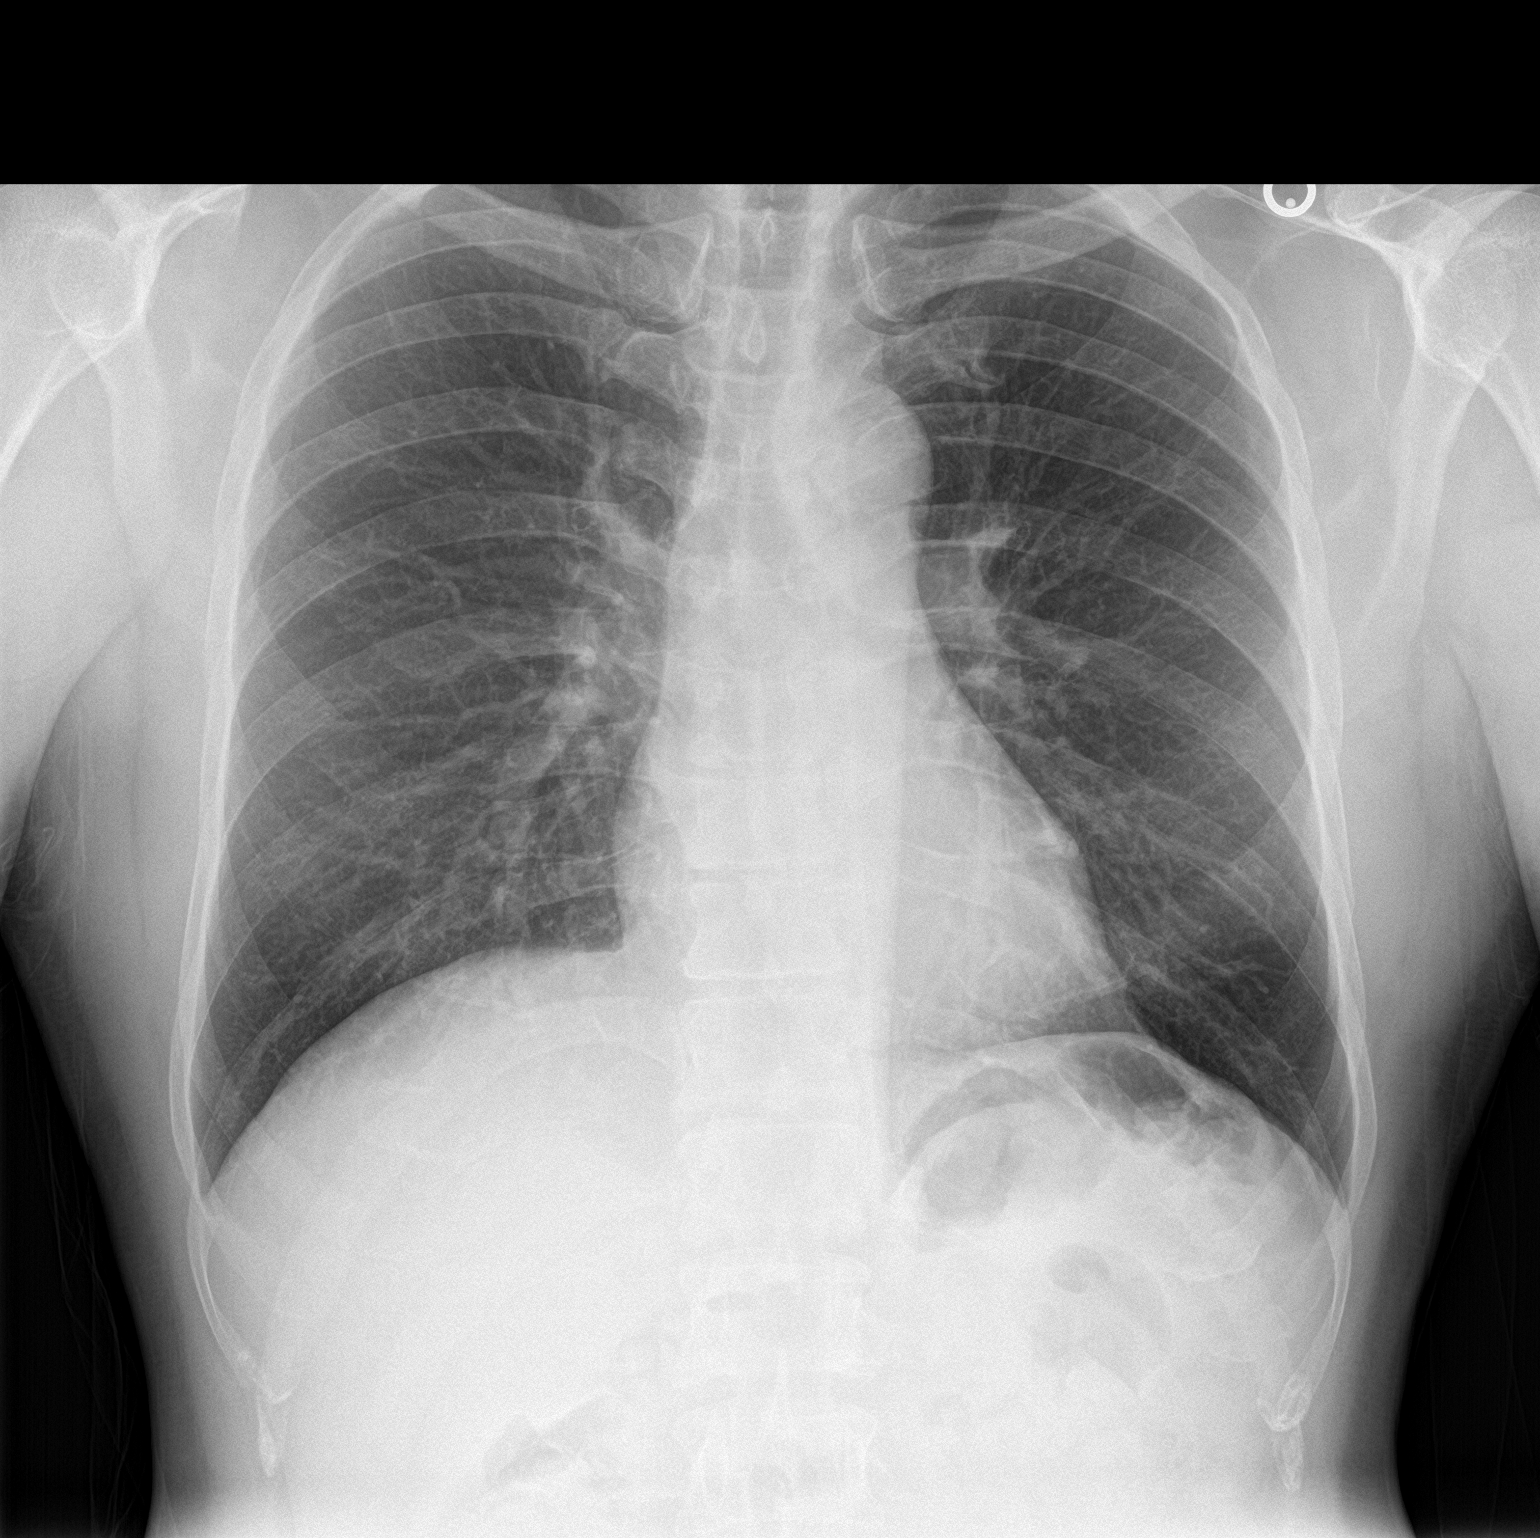

[chest lat]
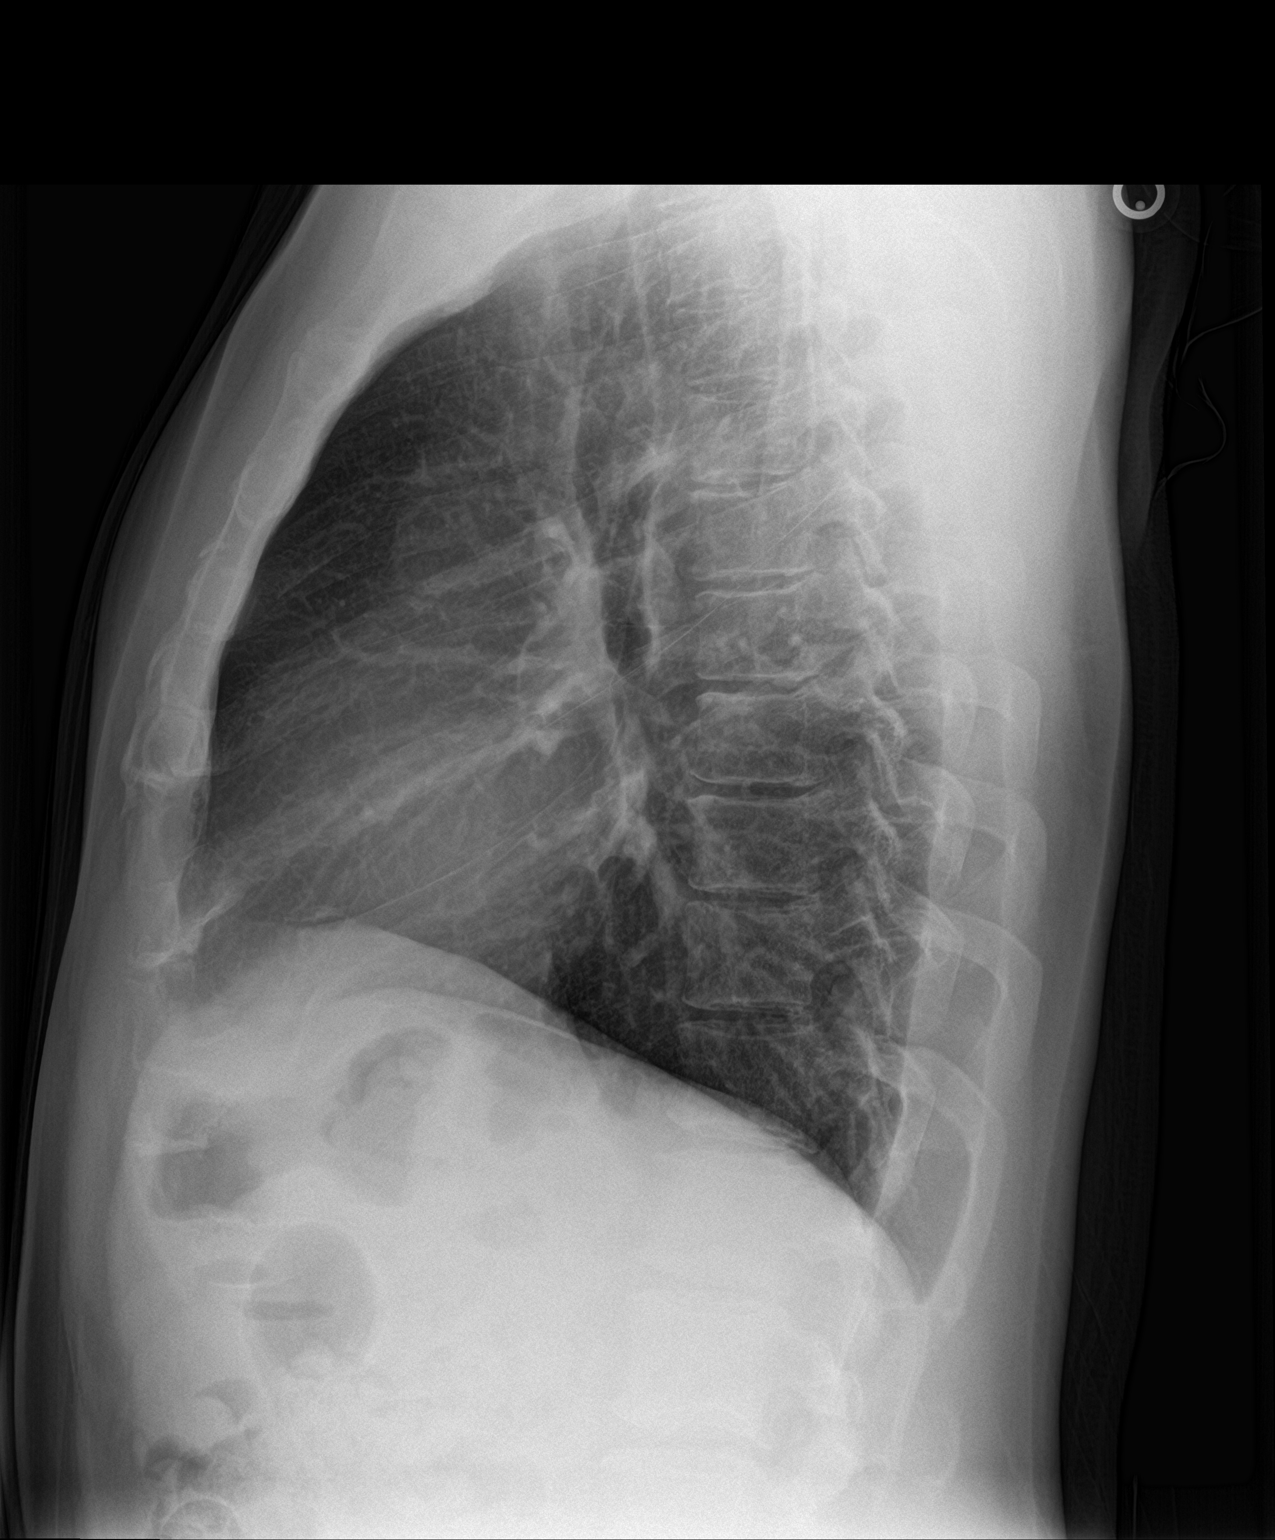

[2 of 2 positions shown; findings below may reference images not displayed]

FINDINGS: Lungs are clear. Heart size and pulmonary vascularity are normal. No
adenopathy. There is postoperative change in the lower cervical
spine.
IMPRESSION: No edema or consolidation.

## 2022-10-21 ENCOUNTER — Emergency Department (HOSPITAL_BASED_OUTPATIENT_CLINIC_OR_DEPARTMENT_OTHER): Payer: BC Managed Care – PPO

## 2022-10-21 ENCOUNTER — Other Ambulatory Visit: Payer: Self-pay

## 2022-10-21 ENCOUNTER — Emergency Department (HOSPITAL_BASED_OUTPATIENT_CLINIC_OR_DEPARTMENT_OTHER)
Admission: EM | Admit: 2022-10-21 | Discharge: 2022-10-21 | Disposition: A | Discharge status: Home / Self Care | Payer: BC Managed Care – PPO | Attending: Emergency Medicine | Admitting: Emergency Medicine

## 2022-10-21 ENCOUNTER — Encounter (HOSPITAL_BASED_OUTPATIENT_CLINIC_OR_DEPARTMENT_OTHER): Payer: Self-pay | Admitting: Emergency Medicine

## 2022-10-21 DIAGNOSIS — U071 COVID-19: Secondary | ICD-10-CM | POA: Diagnosis not present

## 2022-10-21 DIAGNOSIS — I10 Essential (primary) hypertension: Secondary | ICD-10-CM | POA: Diagnosis not present

## 2022-10-21 DIAGNOSIS — R059 Cough, unspecified: Secondary | ICD-10-CM | POA: Diagnosis present

## 2022-10-21 DIAGNOSIS — Z79899 Other long term (current) drug therapy: Secondary | ICD-10-CM | POA: Diagnosis not present

## 2022-10-21 LAB — RESP PANEL BY RT-PCR (RSV, FLU A&B, COVID)  RVPGX2
Influenza A by PCR: NEGATIVE
Influenza B by PCR: NEGATIVE
Resp Syncytial Virus by PCR: NEGATIVE
SARS Coronavirus 2 by RT PCR: POSITIVE — AB

## 2022-10-21 LAB — GROUP A STREP BY PCR: Group A Strep by PCR: NOT DETECTED

## 2022-10-21 MED ORDER — PAXLOVID (300/100) 20 X 150 MG & 10 X 100MG PO TBPK: 3.0000 | ORAL_TABLET | Freq: Two times a day (BID) | ORAL | 0 refills | Status: AC

## 2022-10-21 MED ORDER — ONDANSETRON 4 MG PO TBDP
4.0000 mg | ORAL_TABLET | Freq: Once | ORAL | Status: AC
  Administered 2022-10-21: 4 mg via ORAL
  Filled 2022-10-21: qty 1

## 2022-10-21 MED ORDER — ACETAMINOPHEN 500 MG PO TABS
1000.0000 mg | ORAL_TABLET | Freq: Once | ORAL | Status: AC | PRN
  Administered 2022-10-21: 1000 mg via ORAL
  Filled 2022-10-21: qty 2

## 2022-10-21 MED ORDER — BENZONATATE 100 MG PO CAPS: 100.0000 mg | ORAL_CAPSULE | Freq: Three times a day (TID) | ORAL | 0 refills | Status: DC | PRN

## 2022-10-21 MED ORDER — ONDANSETRON 4 MG PO TBDP: 4.0000 mg | ORAL_TABLET | Freq: Three times a day (TID) | ORAL | 0 refills | Status: DC | PRN

## 2022-10-21 MED ORDER — IBUPROFEN 400 MG PO TABS
400.0000 mg | ORAL_TABLET | Freq: Once | ORAL | Status: AC
  Administered 2022-10-21: 400 mg via ORAL
  Filled 2022-10-21: qty 1

## 2022-10-21 NOTE — ED Provider Notes (Signed)
[place] [facility] AT [facility] Provider Note   CSN: [id number] Arrival date & time: 10/21/22  0446     History  Chief Complaint  Patient presents with   URI   Sore Throat    Jordan Kaiser is a 62 y.o. male.  The history is provided by the patient and medical records.  URI Sore Throat  Jordan Kaiser is a 62 y.o. male who presents to the Emergency Department complaining of bodyaches.  He presents to the emergency department for evaluation of sudden onset body aches, upper back pain, scratchy throat and cough.  He was in his routine state of health and then symptoms started abruptly this evening.  Has associated nausea and diarrhea.  No abdominal pain, vomiting, dysuria, chest pain.  Has a history of hypertension, no additional medical problems.  No reported fevers at home.     Home Medications Prior to Admission medications   Medication Sig Start Date End Date Taking? Authorizing Provider  benzonatate (TESSALON) 100 MG capsule Take 1 capsule (100 mg total) by mouth 3 (three) times daily as needed for cough. 10/21/22  Yes Tilden Fossa, MD  nirmatrelvir & ritonavir (PAXLOVID, 300/100,) 20 x 150 MG & 10 x 100MG  TBPK Take 3 tablets by mouth 2 (two) times daily for 5 days. 10/21/22 10/26/22 Yes 10/28/22, MD  ondansetron (ZOFRAN-ODT) 4 MG disintegrating tablet Take 1 tablet (4 mg total) by mouth every 8 (eight) hours as needed for nausea or vomiting. 10/21/22  Yes 10/23/22, MD  albuterol (PROVENTIL HFA;VENTOLIN HFA) 108 (90 Base) MCG/ACT inhaler Inhale 1-2 puffs into the lungs every 6 (six) hours as needed for wheezing or shortness of breath. 12/06/18   02/05/19, MD  doxycycline (VIBRAMYCIN) 100 MG capsule Take 1 capsule (100 mg total) by mouth 2 (two) times daily. Take with food (Rx void after 12/14/18) 12/06/18   02/05/19, MD  hydrochlorothiazide (MICROZIDE) 12.5 MG capsule Take 12.5 mg by mouth daily.    [provider]   lisinopril (PRINIVIL,ZESTRIL) 10 MG tablet Take 10 mg by mouth daily.    [provider]  omeprazole (PRILOSEC) 20 MG capsule Take 1 capsule (20 mg total) by mouth daily. 12/16/18   12/18/18, MD  oxyCODONE-acetaminophen (PERCOCET/ROXICET) 5-325 MG per tablet Take 1-2 tablets by mouth every 4 (four) hours as needed for severe pain. 08/18/13   08/20/13, PA-C  predniSONE (DELTASONE) 20 MG tablet Take one tab by mouth twice daily for 4 days, then one daily for 3 days. Take with food. 12/06/18   02/05/19, MD      Allergies    Patient has no known allergies.    Review of Systems   Review of Systems  All other systems reviewed and are negative.   Physical Exam Updated Vital Signs BP (!) 138/107 (BP Location: Right Arm)   Pulse (!) 110   Temp (!) 101.9 F (38.8 C) (Oral)   Resp (!) 22   Ht 6\' 2"  (1.88 m)   Wt 87.5 kg   SpO2 98%   BMI 24.78 kg/m  Physical Exam Vitals and nursing note reviewed.  Constitutional:      Appearance: He is well-developed.  HENT:     Head: Normocephalic and atraumatic.     Comments: Mild erythema in the posterior oropharynx without any exudates or edema. Cardiovascular:     Rate and Rhythm: Regular rhythm. Tachycardia present.     Heart sounds: No  murmur heard. Pulmonary:     Effort: Pulmonary effort is normal. No respiratory distress.     Breath sounds: Normal breath sounds.  Abdominal:     Palpations: Abdomen is soft.     Tenderness: There is no abdominal tenderness. There is no guarding or rebound.  Musculoskeletal:        General: No swelling or tenderness.     Comments: 2+ DP pulses bilaterally  Skin:    General: Skin is warm and dry.  Neurological:     Mental Status: He is alert and oriented to person, place, and time.  Psychiatric:        Behavior: Behavior normal.     ED Results / Procedures / Treatments   Labs (all labs ordered are listed, but only abnormal results are displayed) Labs Reviewed  RESP  PANEL BY RT-PCR (RSV, FLU A&B, COVID)  [id number] - Abnormal; Notable for the following components:      Result Value   SARS Coronavirus 2 by RT PCR POSITIVE (*)    All other components within normal limits  GROUP A STREP BY PCR    EKG None  Radiology DG Chest Port 1 View  Result Date: 10/21/2022 CLINICAL DATA:  62 year old male with history of back pain, fever and cough. EXAM: PORTABLE CHEST 1 VIEW COMPARISON:  Chest x-ray 12/16/2018. FINDINGS: Poorly defined vague opacities project over the lower lungs bilaterally (left slightly greater than right) where there is also interstitial prominence and mild peribronchial cuffing. No confluent consolidative airspace disease. No pleural effusions. No pneumothorax. No evidence of pulmonary edema. Heart size is normal. Upper mediastinal contours are within normal limits. Orthopedic fixation hardware in the lower cervical spine incidentally noted. IMPRESSION: 1. Findings are concerning for bronchitis, and potentially developing lower lung predominant pneumonia, as above. Correlation for signs and symptoms of viral infection is recommended. Electronically Signed   By: Vinnie Langton M.D.   On: 10/21/2022 05:57    Procedures Procedures    Medications Ordered in ED Medications  ibuprofen (ADVIL) tablet 400 mg (has no administration in time range)  acetaminophen (TYLENOL) tablet 1,000 mg (1,000 mg Oral Given 10/21/22 0506)  ondansetron (ZOFRAN-ODT) disintegrating tablet 4 mg (4 mg Oral Given 10/21/22 0535)    ED Course/ Medical Decision Making/ A&P                             Medical Decision Making Amount and/or Complexity of Data Reviewed Radiology: ordered.  Risk OTC drugs. Prescription drug management.   Patient here for eval ration of body aches, back pain, scratchy throat that started a few hours prior to ED arrival.  Patient tachycardic, febrile at time of ED presentation.  Lungs clear without respiratory distress.  Patient well-hydrated  and well-perfused on evaluation.  He is positive for COVID-19 infection, negative for influenza, RSV and strep.  He was treated with acetaminophen with improvement in his tachycardia and fever.  Chest x-ray with possible bronchitis versus pneumonia-suspect this is acute viral process over bacterial infection.  Discussed with patient findings of studies.  Patient does have a history of hypertension, he did just have a PCP visit with normal renal function at that time.  Will start Paxlovid therapy after discussion of risks and benefits.  Will prescribe ondansetron as needed nausea as well as Tessalon as needed cough.  Current clinical picture is not consistent with sepsis, PE, myocarditis.  Patient partially improved after acetaminophen in the emergency department.  Discussed return precautions for progressive or concerning symptoms.        Final Clinical Impression(s) / ED Diagnoses Final diagnoses:  [id number] virus infection    Rx / DC Orders ED Discharge Orders          Ordered    nirmatrelvir & ritonavir (PAXLOVID, 300/100,) 20 x 150 MG & 10 x 100MG  TBPK  2 times daily        10/21/22 0614    ondansetron (ZOFRAN-ODT) 4 MG disintegrating tablet  Every 8 hours PRN        10/21/22 0614    benzonatate (TESSALON) 100 MG capsule  3 times daily PRN        10/21/22 2229              Quintella Reichert, MD 10/21/22 [phone]

## 2022-10-21 NOTE — ED Triage Notes (Signed)
Sore throat, body aches, chills, cough and nausea with diarrhea.

## 2022-10-25 ENCOUNTER — Emergency Department (HOSPITAL_BASED_OUTPATIENT_CLINIC_OR_DEPARTMENT_OTHER)
Admission: EM | Admit: 2022-10-25 | Discharge: 2022-10-25 | Disposition: A | Discharge status: Home / Self Care | Payer: BC Managed Care – PPO | Attending: Emergency Medicine | Admitting: Emergency Medicine

## 2022-10-25 ENCOUNTER — Emergency Department (HOSPITAL_BASED_OUTPATIENT_CLINIC_OR_DEPARTMENT_OTHER): Payer: BC Managed Care – PPO

## 2022-10-25 ENCOUNTER — Encounter (HOSPITAL_BASED_OUTPATIENT_CLINIC_OR_DEPARTMENT_OTHER): Payer: Self-pay | Admitting: Emergency Medicine

## 2022-10-25 ENCOUNTER — Other Ambulatory Visit: Payer: Self-pay

## 2022-10-25 DIAGNOSIS — U071 COVID-19: Secondary | ICD-10-CM | POA: Insufficient documentation

## 2022-10-25 DIAGNOSIS — R0602 Shortness of breath: Secondary | ICD-10-CM

## 2022-10-25 MED ORDER — ALBUTEROL SULFATE HFA 108 (90 BASE) MCG/ACT IN AERS
INHALATION_SPRAY | RESPIRATORY_TRACT | Status: AC
  Administered 2022-10-25: 2
  Filled 2022-10-25: qty 6.7

## 2022-10-25 MED ORDER — PREDNISONE 10 MG (21) PO TBPK: ORAL_TABLET | Freq: Every day | ORAL | 0 refills | Status: DC

## 2022-10-25 MED ORDER — ALBUTEROL SULFATE HFA 108 (90 BASE) MCG/ACT IN AERS: 1.0000 | INHALATION_SPRAY | Freq: Four times a day (QID) | RESPIRATORY_TRACT | 0 refills | Status: AC | PRN

## 2022-10-25 NOTE — Discharge Instructions (Addendum)
Please return to the ED if you have worsening shortness of breath, O2 sats below 90%, or chest pain.

## 2022-10-25 NOTE — ED Triage Notes (Signed)
Pt reports shortness of breath for the past 2 days. Also reports weakness for a week. Was recently diagnosed with Covid. Wife says he started feeling worse yesterday like when he first got Covid. Has had a cough with small amount of phlegm.

## 2022-10-25 NOTE — ED Provider Notes (Signed)
[place] [facility] Provider Note   CSN: [id number] Arrival date & time: 10/25/22  8676     History  Chief Complaint  Patient presents with   Shortness of Jordan Kaiser is a 62 y.o. male, no pertinent past medical history, who presents to the ED 2/2 to worsening shortness of breath and cough for the last day.  Wife states that he was diagnosed with COVID about 4 days ago, and had very bad myalgias, sore throat, and cough, but the cough has gotten worse and that he is now developed more shortness of breath.  History of childhood asthma, but no adult asthma.  Denies any kind of fevers, chest pain.     Home Medications Prior to Admission medications   Medication Sig Start Date End Date Taking? Authorizing Provider  albuterol (VENTOLIN HFA) 108 (90 Base) MCG/ACT inhaler Inhale 1-2 puffs into the lungs every 6 (six) hours as needed for wheezing or shortness of breath. 10/25/22  Yes Rochel Privett L, PA  predniSONE (STERAPRED UNI-PAK 21 TAB) 10 MG (21) TBPK tablet Take by mouth daily. Take 6 tabs by mouth daily  for 2 days, then 5 tabs for 2 days, then 4 tabs for 2 days, then 3 tabs for 2 days, 2 tabs for 2 days, then 1 tab by mouth daily for 2 days 10/25/22  Yes Juni Glaab L, PA  benzonatate (TESSALON) 100 MG capsule Take 1 capsule (100 mg total) by mouth 3 (three) times daily as needed for cough. 10/21/22   Quintella Reichert, MD  doxycycline (VIBRAMYCIN) 100 MG capsule Take 1 capsule (100 mg total) by mouth 2 (two) times daily. Take with food (Rx void after 12/14/18) 12/06/18   Kandra Nicolas, MD  hydrochlorothiazide (MICROZIDE) 12.5 MG capsule Take 12.5 mg by mouth daily.    [provider]  lisinopril (PRINIVIL,ZESTRIL) 10 MG tablet Take 10 mg by mouth daily.    [provider]  nirmatrelvir & ritonavir (PAXLOVID, 300/100,) 20 x 150 MG & 10 x 100MG  TBPK Take 3 tablets by mouth 2 (two) times daily for 5 days. 10/21/22 10/26/22   Quintella Reichert, MD  omeprazole (PRILOSEC) 20 MG capsule Take 1 capsule (20 mg total) by mouth daily. 12/16/18   Robyn Haber, MD  ondansetron (ZOFRAN-ODT) 4 MG disintegrating tablet Take 1 tablet (4 mg total) by mouth every 8 (eight) hours as needed for nausea or vomiting. 10/21/22   Quintella Reichert, MD  oxyCODONE-acetaminophen (PERCOCET/ROXICET) 5-325 MG per tablet Take 1-2 tablets by mouth every 4 (four) hours as needed for severe pain. 08/18/13   Lanae Crumbly, PA-C      Allergies    Patient has no known allergies.    Review of Systems   Review of Systems  Respiratory:  Positive for cough and shortness of breath. Negative for chest tightness.   Cardiovascular:  Negative for chest pain.    Physical Exam Updated Vital Signs BP (!) 142/94   Pulse 91   Temp 98 F (36.7 C) (Oral)   Resp (!) 25   SpO2 100%  Physical Exam Vitals and nursing note reviewed.  Constitutional:      General: He is not in acute distress.    Appearance: He is well-developed.  HENT:     Head: Normocephalic and atraumatic.  Eyes:     Conjunctiva/sclera: Conjunctivae normal.  Cardiovascular:     Rate and Rhythm: Normal rate and regular rhythm.  Heart sounds: No murmur heard. Pulmonary:     Effort: Pulmonary effort is normal. No respiratory distress.     Breath sounds: Normal breath sounds.  Abdominal:     Palpations: Abdomen is soft.     Tenderness: There is no abdominal tenderness.  Musculoskeletal:        General: No swelling.     Cervical back: Neck supple.  Skin:    General: Skin is warm and dry.     Capillary Refill: Capillary refill takes less than 2 seconds.  Neurological:     Mental Status: He is alert.  Psychiatric:        Mood and Affect: Mood normal.     ED Results / Procedures / Treatments   Labs (all labs ordered are listed, but only abnormal results are displayed) Labs Reviewed - No data to display  EKG None  Radiology DG Chest Portable 1 View  Result Date:  10/25/2022 CLINICAL DATA:  COVID infection. Shortness of breath for 2 weeks. Dyspnea. EXAM: PORTABLE CHEST 1 VIEW COMPARISON:  10/21/2022 FINDINGS: The heart size and mediastinal contours are within normal limits. Both lungs are clear. The visualized skeletal structures are unremarkable. IMPRESSION: No active disease. Electronically Signed   By: Marlaine Hind M.D.   On: 10/25/2022 09:05    Procedures Procedures    Medications Ordered in ED Medications  albuterol (VENTOLIN HFA) 108 (90 Base) MCG/ACT inhaler (2 puffs  Given 10/25/22 0844)    ED Course/ Medical Decision Making/ A&P                           Medical Decision Making Patient is a 62 year old male, history of COVID, 4 days into COVID, on Paxlovid, states that shortness of breath is getting worse, and his cough is getting worse.  He sounds clear to auscultation but per nursing staff, he had some wheezing prior, and was given albuterol inhaler with some relief.  We will obtain a chest x-ray to evaluate for possible pneumonia.  Amount and/or Complexity of Data Reviewed Radiology: ordered.    Details: Chest x-ray clear Discussion of management or test interpretation with external provider(s): Discussed with patient, his lungs are clear to auscultation, however given wheezing per nursing, we will do a steroid pack, and albuterol inhaler to help with his symptoms, he denies any chest pain, and is nontachypneic, nonhypoxic.  We discussed follow-up with primary care doctor as needed, and return to the ED if symptoms worsening.  Risk Prescription drug management.    Final Clinical Impression(s) / ED Diagnoses Final diagnoses:  SOB (shortness of breath)  COVID-19    Rx / DC Orders ED Discharge Orders          Ordered    albuterol (VENTOLIN HFA) 108 (90 Base) MCG/ACT inhaler  Every 6 hours PRN        10/25/22 0932    predniSONE (STERAPRED UNI-PAK 21 TAB) 10 MG (21) TBPK tablet  Daily        10/25/22 0932               Luretha Eberly, Si Gaul, PA 10/25/22 0936    Elgie Congo, MD 10/25/22 [phone]

## 2023-12-30 ENCOUNTER — Telehealth (INDEPENDENT_AMBULATORY_CARE_PROVIDER_SITE_OTHER): Payer: Self-pay | Admitting: Otolaryngology

## 2023-12-30 NOTE — Telephone Encounter (Signed)
 Called patient to schedule an appt with Dr. Irene Pap.  LVM to call our office.

## 2024-01-10 ENCOUNTER — Encounter (INDEPENDENT_AMBULATORY_CARE_PROVIDER_SITE_OTHER): Payer: Self-pay | Admitting: Otolaryngology

## 2024-01-10 ENCOUNTER — Ambulatory Visit (INDEPENDENT_AMBULATORY_CARE_PROVIDER_SITE_OTHER): Admitting: Otolaryngology

## 2024-01-10 VITALS — BP 151/86 | HR 74 | Ht 74.0 in | Wt 185.0 lb

## 2024-01-10 DIAGNOSIS — K219 Gastro-esophageal reflux disease without esophagitis: Secondary | ICD-10-CM | POA: Diagnosis not present

## 2024-01-10 DIAGNOSIS — M2578 Osteophyte, vertebrae: Secondary | ICD-10-CM | POA: Diagnosis not present

## 2024-01-10 DIAGNOSIS — R1310 Dysphagia, unspecified: Secondary | ICD-10-CM | POA: Diagnosis not present

## 2024-01-10 MED ORDER — PANTOPRAZOLE SODIUM 40 MG PO TBEC: 40.0000 mg | DELAYED_RELEASE_TABLET | Freq: Every day | ORAL | 3 refills | Status: DC

## 2024-01-10 NOTE — Patient Instructions (Signed)

## 2024-01-10 NOTE — Progress Notes (Signed)
 ENT CONSULT:  Reason for Consult: dysphagia  chronic left neck pain  HPI: Discussed the use of AI scribe software for clinical note transcription with the patient, who gave verbal consent to proceed.  History of Present Illness Jordan Kaiser is a 63 year old male with a history of cervical spine surgery 2014 (left sided ACDF) who presents with dysphagia and left arm pain.  He experiences dysphagia, characterized by difficulty swallowing and a sensation of food getting stuck, necessitating multiple swallows to clear it. He occasionally regurgitates undigested food, with symptoms occurring with various foods such as apples, steak, and salad. Consequently, he has adjusted his diet to softer foods like soups and salads. He notes weight fluctuations, currently weighing around 185 pounds, down from 200 pounds. No history of reflux or heartburn is reported.  He has a history of cervical spine surgery in 2014, involving the replacement of three discs (C4, C5, C6) through the anterior neck. Since February of last year, he began experiencing left arm pain and swallowing difficulties. A CT scan revealed an osteophyte. He uses a compression sock for relief of left arm/shoulder pain and has been taking gabapentin, though he prefers to avoid medications. He is scheduled for revision neck procedure with Dr Shon Baton, who also plans to shave off osteophyte at the same time.   The left arm pain radiates from the neck down the arm, suspected to be related to cervical spine issues. The pain is severe, and he uses a compression sleeve for relief. Gabapentin was previously used but discontinued due to a preference to avoid medications.  He underwent a swallowing test at [facility] in October of last year, which showed osteophyte. He has not had an upper endoscopy.  Records Reviewed:  Office visit with Dr Doran Heater [facility] 06/29/23 [name] is a 63 y.o. male is seen in consultation at the request of Alvy Beal, MD for evaluation of globus sensation. He reports dysphagia. Has a h/o ACDF cervical spine >8 years ago. 1-2 years ago, he began to have difficulty swallowing foods. He has a non-productive cough. There is globus sensation. Lots of throat clearing. He reports food can be hard to go down.   Denies weight loss, otalgia, neck masses, hemoptysis. Improved with nothing. Worse with nothing.   Body mass index is 24.65 kg/m.Marland Kitchen Non smoking.,   DATA REVIEW: US Thyroid 03/2023- IMPRESSION:  1) Multinodular goiter. No significant growth or change in any individual thyroid nodules in comparison to previous ultrasound.  2) No significant cervical adenopathy.   RECOMMENDATION:  1) Would not recommend repeat FNA biopsy of any thyroid nodules.  2) Repeat thyroid ultrasound in 2 years.   06/04/23 [facility] is a very pleasant 63 year old gentleman who I performed a two-level ACDF on in 2011. He has done exceptionally well until recently when he started noticing increasing episodes of dysphagia. X-rays clearly show a large anterior exostosis at the adjacent C4-5 level.     Past Medical History:  Diagnosis Date   Asthma    as a child   Headache(784.0)    due to neck problems   Hypertension    Seasonal allergies     Past Surgical History:  Procedure Laterality Date   ANTERIOR CERVICAL DECOMP/DISCECTOMY FUSION N/A 08/17/2013   Procedure: ANTERIOR CERVICAL DECOMPRESSION/DISCECTOMY FUSION 2 LEVELS C5-7;  Surgeon: Venita Lick, MD;  Location: [facility] OR;  Service: Orthopedics;  Laterality: N/A;   APPENDECTOMY     rotor cuff  TONSILLECTOMY     TUMOR REMOVAL Left    benign tumor over left eye    History reviewed. No pertinent family history.  Social History:  reports that he has never smoked. He has never used smokeless tobacco. He reports current alcohol use. He reports that he does not use drugs.  Allergies: No Known Allergies  Medications: I have reviewed the patient's current  medications.  The PMH, PSH, Medications, Allergies, and SH were reviewed and updated.  ROS: Constitutional: Negative for fever, weight loss and weight gain. Cardiovascular: Negative for chest pain and dyspnea on exertion. Respiratory: Is not experiencing shortness of breath at rest. Gastrointestinal: Negative for nausea and vomiting. Neurological: Negative for headaches. Psychiatric: The patient is not nervous/anxious  Blood pressure (!) 151/86, pulse 74, height 6\' 2"  (1.88 m), weight 185 lb (83.9 kg), SpO2 97%. Body mass index is 23.75 kg/m.  PHYSICAL EXAM:  Exam: General: Well-developed, well-nourished Communication and Voice: Clear pitch and clarity Respiratory Respiratory effort: Equal inspiration and expiration without stridor Cardiovascular Peripheral Vascular: Warm extremities with equal color/perfusion Eyes: No nystagmus with equal extraocular motion bilaterally Neuro/Psych/Balance: Patient oriented to person, place, and time; Appropriate mood and affect; Gait is intact with no imbalance; Cranial nerves I-XII are intact Head and Face Inspection: Normocephalic and atraumatic without mass or lesion Palpation: Facial skeleton intact without bony stepoffs Salivary Glands: No mass or tenderness Facial Strength: Facial motility symmetric and full bilaterally ENT Pinna: External ear intact and fully developed External canal: Canal is patent with intact skin Tympanic Membrane: Clear and mobile External Nose: No scar or anatomic deformity Internal Nose: Septum is deviated to the left. No polyp, or purulence. Mucosal edema and erythema present.  Bilateral inferior turbinate hypertrophy.  Lips, Teeth, and gums: Mucosa and teeth intact and viable TMJ: No pain to palpation with full mobility Oral cavity/oropharynx: No erythema or exudate, no lesions present Nasopharynx: No mass or lesion with intact mucosa Hypopharynx: Intact mucosa without pooling of secretions Larynx Glottic:  Full true vocal cord mobility without lesion or mass Supraglottic: Normal appearing epiglottis and AE folds Interarytenoid Space: Moderate pachydermia&edema Subglottic Space: Patent without lesion or edema Neck Neck and Trachea: Midline trachea without mass or lesion Thyroid: No mass or nodularity Lymphatics: No lymphadenopathy  Procedure: Preoperative diagnosis: dysphagia hx of ACDF left side, hx of osteophyte  Postoperative diagnosis:   Same  Procedure: Flexible fiberoptic laryngoscopy  Surgeon: Chalon Zobrist, MD  Anesthesia: Topical lidocaine and Afrin Complications: None Condition is stable throughout exam  Indications and consent:  The patient presents to the clinic with above symptoms. Indirect laryngoscopy view was incomplete. Thus it was recommended that they undergo a flexible fiberoptic laryngoscopy. All of the risks, benefits, and potential complications were reviewed with the patient preoperatively and verbal informed consent was obtained.  Procedure: The patient was seated upright in the clinic. Topical lidocaine and Afrin were applied to the nasal cavity. After adequate anesthesia had occurred, I then proceeded to pass the flexible telescope into the nasal cavity. The nasal cavity was patent without rhinorrhea or polyp. The nasopharynx was also patent without mass or lesion. The base of tongue was visualized and was normal. There were no signs of pooling of secretions in the piriform sinuses. The true vocal folds were mobile bilaterally. There were no signs of glottic or supraglottic mucosal lesion or mass. There was moderate interarytenoid pachydermia and post cricoid edema. The telescope was then slowly withdrawn and the patient tolerated the procedure throughout.    Studies  Reviewed: Barium Swallow  07/16/2023 8:52 AM EDT  CLINICAL DATA:  Dysphagia and globus sensation in the throat. Surgical history of anterior cervical fusion C5 - 7  EXAM: BARIUM  SWALLOW/ESOPHAGRAM  TECHNIQUE: Single contrast examination was performed using thin liquid barium. This exam was performed by Corrin Parker, PA-C, and was supervised and interpreted by Dr. Roque Lias.  FLUOROSCOPY: Radiation Exposure Index (as provided by the fluoroscopic device): 20.10 mGy Kerma  COMPARISON:  None Available.  FINDINGS: Swallowing: Appears normal. No vestibular penetration or aspiration seen.  Pharynx: Surgical hardware seen in the cervical spine with prominent osteophyte at C4-5.  Esophagus: Normal appearance.  No strictures or mucosal lesions.  Esophageal motility: Normal peristaltic wave with each swallow. No dysmotility visualized.  Hiatal Hernia: No hiatal hernia seen.  Gastroesophageal reflux: No spontaneous reflux and no reflux seen with cough or Valsalva.  Ingested 13mm barium tablet: Passed quickly from mouth to stomach without hesitation.  Other: None.  EXAM: BARIUM SWALLOW/ESOPHAGRAM  TECHNIQUE: Single contrast examination was performed using thin liquid barium. This exam was performed by Corrin Parker, PA-C, and was supervised and interpreted by Dr. Roque Lias.  FLUOROSCOPY: Radiation Exposure Index (as provided by the fluoroscopic device): 20.10 mGy Kerma  COMPARISON: None Available.  FINDINGS: Swallowing: Appears normal. No vestibular penetration or aspiration seen.  Pharynx: Surgical hardware seen in the cervical spine with prominent osteophyte at C4-5.  Esophagus: Normal appearance. No strictures or mucosal lesions.  Esophageal motility: Normal peristaltic wave with each swallow. No dysmotility visualized.  Hiatal Hernia: No hiatal hernia seen.  Gastroesophageal reflux: No spontaneous reflux and no reflux seen with cough or Valsalva.  Ingested 13mm barium tablet: Passed quickly from mouth to stomach without hesitation.  Other: None.  IMPRESSION: Prominent osteophyte seen at C4-5 which is possibly the cause  of patient's dysphagia and globus sensation in the throat.     Assessment/Plan: Encounter Diagnoses  Name Primary?   Dysphagia, unspecified type Yes   Osteophyte of cervical spine    Chronic GERD     Assessment and Plan Assessment & Plan Dysphagia Chronic dysphagia with regurgitation, possibly due to cervical osteophyte vs other causes. Flexible scope exam today without pooling of secretions and b/l VF were mobile. He had changes c/w GERD LPR. Previous esophagram with evidence of osteophyte, no other findings. He never had EGD or MSB before.  - Order MBS with speech to be done after his upcoming procedure with Dr Shon Baton  - Prescribe pantoprazole for GERD LPR  Cervical osteophyte Cervical osteophyte on CT scan of the neck done as part of the evaluation of his Left shoulder and arm pain , likely contributing to dysphagia. - Proceed with scheduled surgery with Dr Shon Baton in May. - Reassess swallowing function post-surgery with an MBS swallow study.  GERD LPR - trial of Pantoprazole 40 mg daily 30 min prior to breakfast -  Reflux Gourmet after meals - diet and lifestyle changes to minimize GERD - Refer to [employer] blog for dietary and lifestyle modifications/reflux cook book   RTC 6 mo after MBS   Thank you for allowing me to participate in the care of this patient. Please do not hesitate to contact me with any questions or concerns.   Ashok Croon, MD Otolaryngology [facility] Phone: [phone] Fax: [phone]    01/10/2024, 10:01 AM

## 2024-01-12 ENCOUNTER — Ambulatory Visit (HOSPITAL_COMMUNITY): Payer: Self-pay | Admitting: Physician Assistant

## 2024-01-17 NOTE — Pre-Procedure Instructions (Signed)
 Surgical Instructions   Your procedure is scheduled on Jan 27, 2024. Report to [facility] Main Entrance "A" at 5:30 A.M., then check in with the Admitting office. Any questions or running late day of surgery: call [phone]  Questions prior to your surgery date: call [phone], Monday-Friday, 8am-4pm. If you experience any cold or flu symptoms such as cough, fever, chills, shortness of breath, etc. between now and your scheduled surgery, please notify us  at the above number.     Remember:  Do not eat after midnight the night before your surgery  You may drink clear liquids until 4:30 AM the morning of your surgery.   Clear liquids allowed are: Water, Non-Citrus Juices (without pulp), Carbonated Beverages, Clear Tea (no milk, honey, etc.), Black Coffee Only (NO MILK, CREAM OR POWDERED CREAMER of any kind), and Gatorade.    Take these medicines the morning of surgery with A SIP OF WATER: NONE   May take these medicines IF NEEDED: albuterol  (VENTOLIN  HFA) inhaler - please bring inhaler with you morning of surgery cetirizine (ZYRTEC)    One week prior to surgery, STOP taking any Aspirin (unless otherwise instructed by your surgeon) Aleve, Naproxen, Ibuprofen , Motrin , Advil , Goody's, BC's, all herbal medications, fish oil, and non-prescription vitamins.                     Do NOT Smoke (Tobacco/Vaping) for 24 hours prior to your procedure.  If you use a CPAP at night, you may bring your mask/headgear for your overnight stay.   You will be asked to remove any contacts, glasses, piercing's, hearing aid's, dentures/partials prior to surgery. Please bring cases for these items if needed.    Patients discharged the day of surgery will not be allowed to drive home, and someone needs to stay with them for 24 hours.  SURGICAL WAITING ROOM VISITATION Patients may have no more than 2 support people in the waiting area - these visitors may rotate.   Pre-op nurse will coordinate an  appropriate time for 1 ADULT support person, who may not rotate, to accompany patient in pre-op.  Children under the age of 47 must have an adult with them who is not the patient and must remain in the main waiting area with an adult.  If the patient needs to stay at the hospital during part of their recovery, the visitor guidelines for inpatient rooms apply.  Please refer to the [facility] website for the visitor guidelines for any additional information.   If you received a COVID test during your pre-op visit  it is requested that you wear a mask when out in public, stay away from anyone that may not be feeling well and notify your surgeon if you develop symptoms. If you have been in contact with anyone that has tested positive in the last 10 days please notify you surgeon.      Pre-operative 5 CHG Bathing Instructions   You can play a key role in reducing the risk of infection after surgery. Your skin needs to be as free of germs as possible. You can reduce the number of germs on your skin by washing with CHG (chlorhexidine  gluconate) soap before surgery. CHG is an antiseptic soap that kills germs and continues to kill germs even after washing.   DO NOT use if you have an allergy to chlorhexidine /CHG or antibacterial soaps. If your skin becomes reddened or irritated, stop using the CHG and notify one of our RNs at [phone].  Please shower with the CHG soap starting 4 days before surgery using the following schedule:     Please keep in mind the following:  DO NOT shave, including legs and underarms, starting the day of your first shower.   You may shave your face at any point before/day of surgery.  Place clean sheets on your bed the day you start using CHG soap. Use a clean washcloth (not used since being washed) for each shower. DO NOT sleep with pets once you start using the CHG.   CHG Shower Instructions:  Wash your face and private area with normal soap. If you choose to  wash your hair, wash first with your normal shampoo.  After you use shampoo/soap, rinse your hair and body thoroughly to remove shampoo/soap residue.  Turn the water OFF and apply about 3 tablespoons (45 ml) of CHG soap to a CLEAN washcloth.  Apply CHG soap ONLY FROM YOUR NECK DOWN TO YOUR TOES (washing for 3-5 minutes)  DO NOT use CHG soap on face, private areas, open wounds, or sores.  Pay special attention to the area where your surgery is being performed.  If you are having back surgery, having someone wash your back for you may be helpful. Wait 2 minutes after CHG soap is applied, then you may rinse off the CHG soap.  Pat dry with a clean towel  Put on clean clothes/pajamas   If you choose to wear lotion, please use ONLY the CHG-compatible lotions that are listed below.  Additional instructions for the day of surgery: DO NOT APPLY any lotions, deodorants, cologne, or perfumes.   Do not bring valuables to the hospital. [facility] is not responsible for any belongings/valuables. Do not wear nail polish, gel polish, artificial nails, or any other type of covering on natural nails (fingers and toes) Do not wear jewelry or makeup Put on clean/comfortable clothes.  Please brush your teeth.  Ask your nurse before applying any prescription medications to the skin.     CHG Compatible Lotions   Aveeno Moisturizing lotion  Cetaphil Moisturizing Cream  Cetaphil Moisturizing Lotion  Clairol Herbal Essence Moisturizing Lotion, Dry Skin  Clairol Herbal Essence Moisturizing Lotion, Extra Dry Skin  Clairol Herbal Essence Moisturizing Lotion, Normal Skin  Curel Age Defying Therapeutic Moisturizing Lotion with Alpha Hydroxy  Curel Extreme Care Body Lotion  Curel Soothing Hands Moisturizing Hand Lotion  Curel Therapeutic Moisturizing Cream, Fragrance-Free  Curel Therapeutic Moisturizing Lotion, Fragrance-Free  Curel Therapeutic Moisturizing Lotion, Original Formula  Eucerin Daily Replenishing  Lotion  Eucerin Dry Skin Therapy Plus Alpha Hydroxy Crme  Eucerin Dry Skin Therapy Plus Alpha Hydroxy Lotion  Eucerin Original Crme  Eucerin Original Lotion  Eucerin Plus Crme Eucerin Plus Lotion  Eucerin TriLipid Replenishing Lotion  Keri Anti-Bacterial Hand Lotion  Keri Deep Conditioning Original Lotion Dry Skin Formula Softly Scented  Keri Deep Conditioning Original Lotion, Fragrance Free Sensitive Skin Formula  Keri Lotion Fast Absorbing Fragrance Free Sensitive Skin Formula  Keri Lotion Fast Absorbing Softly Scented Dry Skin Formula  Keri Original Lotion  Keri Skin Renewal Lotion Keri Silky Smooth Lotion  Keri Silky Smooth Sensitive Skin Lotion  Nivea Body Creamy Conditioning Oil  Nivea Body Extra Enriched Teacher, adult education Moisturizing Lotion Nivea Crme  Nivea Skin Firming Lotion  NutraDerm 30 Skin Lotion  NutraDerm Skin Lotion  NutraDerm Therapeutic Skin Cream  NutraDerm Therapeutic Skin Lotion  ProShield Protective Hand Cream  Provon moisturizing lotion  Please read  over the following fact sheets that you were given.

## 2024-01-18 ENCOUNTER — Other Ambulatory Visit: Payer: Self-pay

## 2024-01-18 ENCOUNTER — Encounter (HOSPITAL_COMMUNITY): Payer: Self-pay

## 2024-01-18 ENCOUNTER — Encounter (HOSPITAL_COMMUNITY)
Admission: RE | Admit: 2024-01-18 | Discharge: 2024-01-18 | Disposition: A | Discharge status: Home / Self Care | Source: Ambulatory Visit | Attending: Orthopedic Surgery | Admitting: Orthopedic Surgery

## 2024-01-18 VITALS — BP 125/73 | HR 82 | Temp 98.5°F | Resp 17 | Ht 74.0 in | Wt 192.0 lb

## 2024-01-18 DIAGNOSIS — Z01818 Encounter for other preprocedural examination: Secondary | ICD-10-CM

## 2024-01-18 DIAGNOSIS — Z01812 Encounter for preprocedural laboratory examination: Secondary | ICD-10-CM | POA: Diagnosis present

## 2024-01-18 LAB — SURGICAL PCR SCREEN
MRSA, PCR: NEGATIVE
Staphylococcus aureus: NEGATIVE

## 2024-01-18 NOTE — Progress Notes (Signed)
 PCP - Dr. Karleen Overall Cardiologist - Denies  PPM/ICD - Denies Device Orders -  Rep Notified -   Chest x-ray -  EKG - 12-28-23 - CE, requested from [facility] Stress Test - Denies ECHO - Denies Cardiac Cath - Denies  Sleep Study - Denies CPAP - n/a  NON-diabetic  Last dose of GLP1 agonist-  Denies GLP1 instructions: n/a  Blood Thinner Instructions:Denies Aspirin Instructions:Denies  ERAS Protcol - clears until 0430 PRE-SURGERY Ensure or G2- none  COVID TEST- n/a   Anesthesia review: yes, EKG requested on 01-18-24.  Patient stated he just had his labs drawn on 12-28-23 at [facility] and does not want to have them redrawn.   Patient denies shortness of breath, fever, cough and chest pain at PAT appointment. Patient denies any respiratory issues at this time.    All instructions explained to the patient, with a verbal understanding of the material. Patient agrees to go over the instructions while at home for a better understanding. Patient also instructed to self quarantine after being tested for COVID-19. The opportunity to ask questions was provided.

## 2024-01-21 ENCOUNTER — Telehealth (HOSPITAL_COMMUNITY): Payer: Self-pay | Admitting: *Deleted

## 2024-01-21 NOTE — Telephone Encounter (Signed)
 Attempted to contact patient to schedule OP MBS. Left VM @ [phone]. RKEEL

## 2024-01-26 NOTE — Anesthesia Preprocedure Evaluation (Addendum)
 Anesthesia Evaluation  Patient identified by MRN, date of birth, ID band Patient awake    Reviewed: Allergy & Precautions, NPO status , Patient's Chart, lab work & pertinent test results  History of Anesthesia Complications Negative for: history of anesthetic complications  Airway Mallampati: II  TM Distance: >3 FB Neck ROM: Full    Dental no notable dental hx.    Pulmonary asthma    Pulmonary exam normal        Cardiovascular hypertension, Pt. 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  Reproductive/Obstetrics                              Anesthesia Physical Anesthesia Plan  ASA: 2  Anesthesia Plan: General   Post-op Pain Management: Tylenol  PO (pre-op)*, Ketamine IV* and Dilaudid  IV   Induction: Intravenous  PONV Risk Score and Plan: 2 and Ondansetron , Dexamethasone , Treatment may vary due to age or medical condition and Midazolam   Airway Management Planned: Oral ETT and Video Laryngoscope Planned  Additional Equipment: None  Intra-op Plan:   Post-operative Plan: Extubation in OR  Informed Consent: I have reviewed the patients History and Physical, chart, labs and discussed the procedure including the risks, benefits and alternatives for the proposed anesthesia with the patient or authorized representative who has indicated his/her understanding and acceptance.     Dental advisory given  Plan Discussed with: CRNA  Anesthesia Plan Comments:         Anesthesia Quick Evaluation

## 2024-01-27 ENCOUNTER — Other Ambulatory Visit: Payer: Self-pay

## 2024-01-27 ENCOUNTER — Ambulatory Visit (HOSPITAL_COMMUNITY)
Admission: RE | Admit: 2024-01-27 | Discharge: 2024-01-27 | Disposition: A | Discharge status: Home / Self Care | Attending: Orthopedic Surgery | Admitting: Orthopedic Surgery

## 2024-01-27 ENCOUNTER — Ambulatory Visit (HOSPITAL_COMMUNITY): Payer: Self-pay | Admitting: Physician Assistant

## 2024-01-27 ENCOUNTER — Ambulatory Visit (HOSPITAL_COMMUNITY): Payer: Self-pay | Admitting: Anesthesiology

## 2024-01-27 ENCOUNTER — Encounter (HOSPITAL_COMMUNITY)
Admission: RE | Disposition: A | Discharge status: Home / Self Care | Payer: Self-pay | Source: Home / Self Care | Attending: Orthopedic Surgery

## 2024-01-27 ENCOUNTER — Encounter (HOSPITAL_COMMUNITY): Payer: Self-pay | Admitting: Orthopedic Surgery

## 2024-01-27 ENCOUNTER — Ambulatory Visit (HOSPITAL_COMMUNITY)

## 2024-01-27 DIAGNOSIS — R1310 Dysphagia, unspecified: Secondary | ICD-10-CM | POA: Insufficient documentation

## 2024-01-27 DIAGNOSIS — M899 Disorder of bone, unspecified: Secondary | ICD-10-CM | POA: Diagnosis present

## 2024-01-27 DIAGNOSIS — Z79899 Other long term (current) drug therapy: Secondary | ICD-10-CM | POA: Insufficient documentation

## 2024-01-27 DIAGNOSIS — I10 Essential (primary) hypertension: Secondary | ICD-10-CM | POA: Diagnosis not present

## 2024-01-27 DIAGNOSIS — M2578 Osteophyte, vertebrae: Secondary | ICD-10-CM | POA: Insufficient documentation

## 2024-01-27 DIAGNOSIS — Z981 Arthrodesis status: Secondary | ICD-10-CM | POA: Diagnosis not present

## 2024-01-27 DIAGNOSIS — M4602 Spinal enthesopathy, cervical region: Secondary | ICD-10-CM | POA: Insufficient documentation

## 2024-01-27 DIAGNOSIS — J45909 Unspecified asthma, uncomplicated: Secondary | ICD-10-CM | POA: Insufficient documentation

## 2024-01-27 HISTORY — PX: REMOVAL OF CERVICAL EXOSTOSIS: SHX6381

## 2024-01-27 LAB — CBC
HCT: 41.1 % (ref 39.0–52.0)
Hemoglobin: 14.3 g/dL (ref 13.0–17.0)
MCH: 31.1 pg (ref 26.0–34.0)
MCHC: 34.8 g/dL (ref 30.0–36.0)
MCV: 89.3 fL (ref 80.0–100.0)
Platelets: 204 10*3/uL (ref 150–400)
RBC: 4.6 MIL/uL (ref 4.22–5.81)
RDW: 12.1 % (ref 11.5–15.5)
WBC: 5.4 10*3/uL (ref 4.0–10.5)
nRBC: 0 % (ref 0.0–0.2)

## 2024-01-27 LAB — BASIC METABOLIC PANEL WITH GFR
Anion gap: 7 (ref 5–15)
BUN: 14 mg/dL (ref 6–20)
CO2: 29 mmol/L (ref 22–32)
Calcium: 8.9 mg/dL (ref 8.9–10.3)
Chloride: 103 mmol/L (ref 98–111)
Creatinine, Ser: 1.03 mg/dL (ref 0.61–1.24)
GFR, Estimated: >60 mL/min (ref >60)
Glucose, Bld: 118 mg/dL — ABNORMAL HIGH (ref 70–99)
Potassium: 4 mmol/L (ref 3.5–5.1)
Sodium: 139 mmol/L (ref 135–145)

## 2024-01-27 SURGERY — EXCISION, EXOSTOSIS, SPINE, CERVICAL
Anesthesia: General

## 2024-01-27 MED ORDER — HYDROMORPHONE HCL 1 MG/ML IJ SOLN
INTRAMUSCULAR | Status: DC | PRN
  Administered 2024-01-27: .25 mg via INTRAVENOUS

## 2024-01-27 MED ORDER — OXYCODONE-ACETAMINOPHEN 10-325 MG PO TABS: 1.0000 | ORAL_TABLET | Freq: Four times a day (QID) | ORAL | 0 refills | Status: DC | PRN

## 2024-01-27 MED ORDER — ACETAMINOPHEN 500 MG PO TABS
1000.0000 mg | ORAL_TABLET | Freq: Once | ORAL | Status: AC
Start: 2024-01-27 — End: 2024-01-27
  Administered 2024-01-27: 1000 mg via ORAL
  Filled 2024-01-27: qty 2

## 2024-01-27 MED ORDER — ALBUMIN HUMAN 5 % IV SOLN: INTRAVENOUS | Status: DC | PRN

## 2024-01-27 MED ORDER — HYDROMORPHONE HCL 1 MG/ML IJ SOLN
INTRAMUSCULAR | Status: AC
  Filled 2024-01-27: qty 0.5

## 2024-01-27 MED ORDER — LIDOCAINE 2% (20 MG/ML) 5 ML SYRINGE
INTRAMUSCULAR | Status: DC | PRN
  Administered 2024-01-27: 100 mg via INTRAVENOUS

## 2024-01-27 MED ORDER — ONDANSETRON HCL 4 MG PO TABS: 4.0000 mg | ORAL_TABLET | Freq: Three times a day (TID) | ORAL | 0 refills | Status: AC | PRN

## 2024-01-27 MED ORDER — ORAL CARE MOUTH RINSE: 15.0000 mL | Freq: Once | OROMUCOSAL | Status: AC

## 2024-01-27 MED ORDER — BUPIVACAINE-EPINEPHRINE (PF) 0.25% -1:200000 IJ SOLN
INTRAMUSCULAR | Status: AC
  Filled 2024-01-27: qty 30

## 2024-01-27 MED ORDER — PHENYLEPHRINE HCL-NACL 20-0.9 MG/250ML-% IV SOLN
INTRAVENOUS | Status: DC | PRN
  Administered 2024-01-27: 35 ug/min via INTRAVENOUS

## 2024-01-27 MED ORDER — CHLORHEXIDINE GLUCONATE 0.12 % MT SOLN
15.0000 mL | Freq: Once | OROMUCOSAL | Status: AC
  Administered 2024-01-27: 15 mL via OROMUCOSAL
  Filled 2024-01-27: qty 15

## 2024-01-27 MED ORDER — PREDNISONE 5 MG/5ML PO SOLN: 5.0000 mg | Freq: Four times a day (QID) | ORAL | 0 refills | Status: DC

## 2024-01-27 MED ORDER — TRANEXAMIC ACID-NACL 1000-0.7 MG/100ML-% IV SOLN
1000.0000 mg | INTRAVENOUS | Status: AC
  Administered 2024-01-27: 1000 mg via INTRAVENOUS
  Filled 2024-01-27: qty 100

## 2024-01-27 MED ORDER — PHENYLEPHRINE 80 MCG/ML (10ML) SYRINGE FOR IV PUSH (FOR BLOOD PRESSURE SUPPORT)
PREFILLED_SYRINGE | INTRAVENOUS | Status: DC | PRN
  Administered 2024-01-27: 160 ug via INTRAVENOUS
  Administered 2024-01-27: 120 ug via INTRAVENOUS

## 2024-01-27 MED ORDER — DEXAMETHASONE SODIUM PHOSPHATE 10 MG/ML IJ SOLN
INTRAMUSCULAR | Status: DC | PRN
  Administered 2024-01-27: 10 mg via INTRAVENOUS

## 2024-01-27 MED ORDER — PROPOFOL 10 MG/ML IV BOLUS
INTRAVENOUS | Status: AC
  Filled 2024-01-27: qty 20

## 2024-01-27 MED ORDER — BUPIVACAINE-EPINEPHRINE 0.25% -1:200000 IJ SOLN
INTRAMUSCULAR | Status: DC | PRN
  Administered 2024-01-27: 3 mL

## 2024-01-27 MED ORDER — PROPOFOL 10 MG/ML IV BOLUS
INTRAVENOUS | Status: DC | PRN
  Administered 2024-01-27: 150 mg via INTRAVENOUS

## 2024-01-27 MED ORDER — DROPERIDOL 2.5 MG/ML IJ SOLN: 0.6250 mg | Freq: Once | INTRAMUSCULAR | Status: DC | PRN

## 2024-01-27 MED ORDER — LACTATED RINGERS IV SOLN: INTRAVENOUS | Status: DC

## 2024-01-27 MED ORDER — MIDAZOLAM HCL 2 MG/2ML IJ SOLN
INTRAMUSCULAR | Status: AC
  Filled 2024-01-27: qty 2

## 2024-01-27 MED ORDER — FENTANYL CITRATE (PF) 250 MCG/5ML IJ SOLN
INTRAMUSCULAR | Status: AC
  Filled 2024-01-27: qty 5

## 2024-01-27 MED ORDER — OXYCODONE-ACETAMINOPHEN 10-325 MG PO TABS: 1.0000 | ORAL_TABLET | Freq: Four times a day (QID) | ORAL | 0 refills | Status: AC | PRN

## 2024-01-27 MED ORDER — METHOCARBAMOL 500 MG PO TABS: 500.0000 mg | ORAL_TABLET | Freq: Three times a day (TID) | ORAL | 0 refills | Status: AC | PRN

## 2024-01-27 MED ORDER — ONDANSETRON HCL 4 MG/2ML IJ SOLN
INTRAMUSCULAR | Status: DC | PRN
  Administered 2024-01-27: 4 mg via INTRAVENOUS

## 2024-01-27 MED ORDER — CEFAZOLIN SODIUM-DEXTROSE 2-4 GM/100ML-% IV SOLN
2.0000 g | INTRAVENOUS | Status: AC
  Administered 2024-01-27: 2 g via INTRAVENOUS
  Filled 2024-01-27: qty 100

## 2024-01-27 MED ORDER — HYDROMORPHONE HCL 1 MG/ML IJ SOLN
0.2500 mg | INTRAMUSCULAR | Status: DC | PRN
  Administered 2024-01-27 (×2): 0.5 mg via INTRAVENOUS

## 2024-01-27 MED ORDER — 0.9 % SODIUM CHLORIDE (POUR BTL) OPTIME
TOPICAL | Status: DC | PRN
  Administered 2024-01-27: 1000 mL

## 2024-01-27 MED ORDER — PREDNISONE 5 MG/5ML PO SOLN: 5.0000 mg | Freq: Four times a day (QID) | ORAL | 0 refills | Status: AC

## 2024-01-27 MED ORDER — FENTANYL CITRATE (PF) 250 MCG/5ML IJ SOLN
INTRAMUSCULAR | Status: DC | PRN
  Administered 2024-01-27: 50 ug via INTRAVENOUS
  Administered 2024-01-27: 100 ug via INTRAVENOUS
  Administered 2024-01-27: 50 ug via INTRAVENOUS

## 2024-01-27 MED ORDER — HYDROMORPHONE HCL 1 MG/ML IJ SOLN
INTRAMUSCULAR | Status: AC
  Filled 2024-01-27: qty 1

## 2024-01-27 MED ORDER — ONDANSETRON HCL 4 MG PO TABS: 4.0000 mg | ORAL_TABLET | Freq: Three times a day (TID) | ORAL | 0 refills | Status: DC | PRN

## 2024-01-27 MED ORDER — MIDAZOLAM HCL 2 MG/2ML IJ SOLN
INTRAMUSCULAR | Status: DC | PRN
Start: 2024-01-27 — End: 2024-01-27
  Administered 2024-01-27: 2 mg via INTRAVENOUS

## 2024-01-27 MED ORDER — ROCURONIUM BROMIDE 10 MG/ML (PF) SYRINGE
PREFILLED_SYRINGE | INTRAVENOUS | Status: DC | PRN
  Administered 2024-01-27: 70 mg via INTRAVENOUS
  Administered 2024-01-27: 20 mg via INTRAVENOUS

## 2024-01-27 MED ORDER — SUGAMMADEX SODIUM 200 MG/2ML IV SOLN
INTRAVENOUS | Status: DC | PRN
  Administered 2024-01-27: 200 mg via INTRAVENOUS

## 2024-01-27 SURGICAL SUPPLY — 56 items
BAG COUNTER SPONGE SURGICOUNT (BAG) ×1 IMPLANT
BAND RUBBER #18 3X1/16 STRL (MISCELLANEOUS) IMPLANT
BENZOIN TINCTURE PRP APPL 2/3 (GAUZE/BANDAGES/DRESSINGS) IMPLANT
BLADE CLIPPER SURG (BLADE) IMPLANT
BUR MATCHSTICK NEURO 3.0 LAGG (BURR) IMPLANT
CABLE BIPOLOR RESECTION CORD (MISCELLANEOUS) ×1 IMPLANT
CANISTER SUCT 3000ML PPV (MISCELLANEOUS) ×1 IMPLANT
CLSR STERI-STRIP ANTIMIC 1/2X4 (GAUZE/BANDAGES/DRESSINGS) ×1 IMPLANT
COVER MAYO STAND STRL (DRAPES) ×3 IMPLANT
COVER SURGICAL LIGHT HANDLE (MISCELLANEOUS) ×2 IMPLANT
DRAIN CHANNEL 15F RND FF W/TCR (WOUND CARE) IMPLANT
DRAPE C-ARM 42X72 X-RAY (DRAPES) ×1 IMPLANT
DRAPE POUCH INSTRU U-SHP 10X18 (DRAPES) ×1 IMPLANT
DRAPE SURG 17X23 STRL (DRAPES) ×1 IMPLANT
DRAPE U-SHAPE 47X51 STRL (DRAPES) ×2 IMPLANT
DRSG OPSITE POSTOP 3X4 (GAUZE/BANDAGES/DRESSINGS) ×1 IMPLANT
DURAPREP 26ML APPLICATOR (WOUND CARE) ×1 IMPLANT
ELECT COATED BLADE 2.86 ST (ELECTRODE) ×1 IMPLANT
ELECT PENCIL ROCKER SW 15FT (MISCELLANEOUS) ×1 IMPLANT
ELECTRODE REM PT RTRN 9FT ADLT (ELECTROSURGICAL) ×1 IMPLANT
GLOVE BIO SURGEON STRL SZ 6.5 (GLOVE) ×1 IMPLANT
GLOVE BIOGEL PI IND STRL 6.5 (GLOVE) ×1 IMPLANT
GLOVE BIOGEL PI IND STRL 8.5 (GLOVE) ×1 IMPLANT
GLOVE SS BIOGEL STRL SZ 8.5 (GLOVE) ×2 IMPLANT
GOWN STRL REUS W/ TWL LRG LVL3 (GOWN DISPOSABLE) ×1 IMPLANT
GOWN STRL REUS W/TWL 2XL LVL3 (GOWN DISPOSABLE) ×2 IMPLANT
KIT BASIN OR (CUSTOM PROCEDURE TRAY) ×1 IMPLANT
KIT TURNOVER KIT B (KITS) ×1 IMPLANT
NDL HYPO 22X1.5 SAFETY MO (MISCELLANEOUS) ×1 IMPLANT
NDL SPNL 18GX3.5 QUINCKE PK (NEEDLE) ×1 IMPLANT
NEEDLE HYPO 22X1.5 SAFETY MO (MISCELLANEOUS) ×1 IMPLANT
NEEDLE SPNL 18GX3.5 QUINCKE PK (NEEDLE) ×1 IMPLANT
NS IRRIG 1000ML POUR BTL (IV SOLUTION) ×1 IMPLANT
PACK ORTHO CERVICAL (CUSTOM PROCEDURE TRAY) ×1 IMPLANT
PACK UNIVERSAL I (CUSTOM PROCEDURE TRAY) ×1 IMPLANT
PAD ARMBOARD POSITIONER FOAM (MISCELLANEOUS) ×2 IMPLANT
PATTIES SURGICAL .25X.25 (GAUZE/BANDAGES/DRESSINGS) IMPLANT
PATTIES SURGICAL .5 X.5 (GAUZE/BANDAGES/DRESSINGS) IMPLANT
POSITIONER HEAD DONUT 9IN (MISCELLANEOUS) ×1 IMPLANT
RESTRAINT LIMB HOLDER UNIV (RESTRAINTS) ×1 IMPLANT
SPONGE INTESTINAL PEANUT (DISPOSABLE) ×2 IMPLANT
SPONGE SURGIFOAM ABS GEL 100 (HEMOSTASIS) ×1 IMPLANT
SPONGE T-LAP 4X18 ~~LOC~~+RFID (SPONGE) IMPLANT
SURGIFLO W/THROMBIN 8M KIT (HEMOSTASIS) IMPLANT
SUT BONE WAX W31G (SUTURE) ×1 IMPLANT
SUT MNCRL AB 3-0 PS2 27 (SUTURE) ×1 IMPLANT
SUT SILK 2 0 REEL (SUTURE) IMPLANT
SUT VIC AB 2-0 CT1 18 (SUTURE) ×1 IMPLANT
SYR BULB IRRIG 60ML STRL (SYRINGE) IMPLANT
SYR CONTROL 10ML LL (SYRINGE) ×1 IMPLANT
TAPE CLOTH 4X10 WHT NS (GAUZE/BANDAGES/DRESSINGS) ×1 IMPLANT
TAPE UMBILICAL 1/8X30 (MISCELLANEOUS) ×2 IMPLANT
TOWEL GREEN STERILE (TOWEL DISPOSABLE) ×1 IMPLANT
TOWEL GREEN STERILE FF (TOWEL DISPOSABLE) ×1 IMPLANT
TRAY FOLEY MTR SLVR 16FR STAT (SET/KITS/TRAYS/PACK) IMPLANT
WATER STERILE IRR 1000ML POUR (IV SOLUTION) ×1 IMPLANT

## 2024-01-27 NOTE — Transfer of Care (Signed)
 Immediate Anesthesia Transfer of Care Note  Patient: Jordan Kaiser  Procedure(s) Performed: EXCISION, EXOSTOSIS, SPINE, CERVICAL  Patient Location: PACU  Anesthesia Type:General  Level of Consciousness: awake and alert   Airway & Oxygen Therapy: Patient Spontanous Breathing  Post-op Assessment: Report given to RN  Post vital signs: Reviewed and stable  Last Vitals:  Vitals Value Taken Time  BP 138/88 01/27/24 0946  Temp    Pulse 75 01/27/24 0950  Resp 13 01/27/24 0950  SpO2 97 % 01/27/24 0950  Vitals shown include unfiled device data.  Last Pain:  Vitals:   01/27/24 0625  TempSrc:   PainSc: 6          Complications: No notable events documented.

## 2024-01-27 NOTE — Anesthesia Procedure Notes (Signed)
 Procedure Name: Intubation Date/Time: 01/27/2024 7:58 AM  Performed by: Hershall Lory, CRNAPre-anesthesia Checklist: Patient identified, Emergency Drugs available, Suction available and Patient being monitored Patient Re-evaluated:Patient Re-evaluated prior to induction Oxygen Delivery Method: Circle system utilized Preoxygenation: Pre-oxygenation with 100% oxygen Induction Type: IV induction Ventilation: Mask ventilation without difficulty Laryngoscope Size: Glidescope Grade View: Grade I Tube type: Oral Tube size: 7.5 mm Number of attempts: 1 Airway Equipment and Method: Stylet Placement Confirmation: ETT inserted through vocal cords under direct vision, positive ETCO2 and breath sounds checked- equal and bilateral Secured at: 22 cm Tube secured with: Tape Dental Injury: Teeth and Oropharynx as per pre-operative assessment

## 2024-01-27 NOTE — Discharge Instructions (Signed)
 Today you will be discharged from the hospital.  The purpose of the following handout is to help guide you over the next 2 weeks.  First and foremost, be sure you have a follow up appointment with Dr. Shon Baton 2 weeks from the time of your surgery to have your sutures removed.  Please call [facility] [phone] to schedule or confirm this appointment.      Brace You do not have to wear the collar while lying in bed or sitting in a high-backed chair, eating, sleeping or showering.  Other than these instances, you must wear the brace.  You may NOT wear the collar while driving a vehicle (see driving restrictions below).  It is advisable that you wear the collar in public places or while traveling in a car as a passenger.  Dr. Shon Baton will discuss further use of the collar at your 2 week postop visit.  Wound Care You may SHOWER 5 days from the date of surgery.  Shower directly over the steri-strips.  DO NOT scrub or submerge (bath tub, swimming pool, hot tub, etc.) the area.  Pat to dry following your shower.  There is no need for additional dressings other than the steri-strips.  Allow the steri-strips to fall off on their own.  Once the strips have fallen off, you may leave the area undressed.  DO NOT apply lotion/cream/ointment to the area.  The wound must remain dry at all times other than while showering.  Dr. Shon Baton or his staff will remove your stiches at your first postop visit and give you additional instructions regarding wound care at that time.   Activity NO DRIVING FOR 2 WEEKS.  No lifting over 5 pounds (approximately a gallon of milk).  No bending, stooping, squatting or twisting.  No overhead activities.  We encourage you to walk (short distances and often throughout the day) as you can tolerate.  A good rule of thumb is to get up and move once or twice every hour.  You may go up and down stairs carefully.  As you continue to recover, Dr. Shon Baton will address and  adjust restrictions to your activities until no further restrictions are needed.  However, until your first postop visit, when Dr. Shon Baton can assess your recovery, you are to follow these instructions.  At the end of this document is a tentative outline of activities for up to 1 year.       Medication You will be discharged from the hospital with medication for pain, spasm, nausea and constipation.  You will be given enough medication to last until your first postop visit in 2 weeks.  Medications WILL NOT BE REFILLED EARLY; therefore, you are to take the medications only as directed.  If you have been given multiple prescriptions, please leave them with your pharmacy.  They can keep them on file for when you need them.  Medications that are lost or stolen WILL NOT be replaced.  We will address the need for continuing certain medications on an individual basis during your postop visit.  We ask that you avoid over the counter anti-inflammatory medications (Advil, Aleve, Motrin) for 3 months.    What you can expect following neck surgery... It is not uncommon to experience a sore throat or difficulty swallowing following neck surgery.  Cold liquids and soft foods are helpful in soothing this discomfort.  There is no specific diet that you are to follow after surgery, however, there are a  few things you should keep in mind to avoid unneeded discomfort.  Take small bites and eat slowly.  Chew your food thoroughly before swallowing.   It is not uncommon to experience incisional soreness or pain in the back of the neck, shoulders or between the shoulder blades.  These symptoms will slowly begin to resolve as you continue to recover, however, they can last for a few weeks.    It is not uncommon to experience INTERMITTENT arm pain following surgery.  This pain can mimic the arm pain you had prior to surgery.  As long as the pain resolves on its own and is not constant, there is no need to become alarmed.    When To Call If you experience fever >101F, loss of bowel or bladder control, painful swelling in the lower extremities, constant (unresolving) arm pain.  If you experience any of these symptoms, please call [facility] [phone].  What's Next As mentioned earlier, you will follow up with Dr. Shon Baton in 2 weeks.  At that time, we will likely remove your stitches and discuss additional aspects of your recovery.                   ACTIVITY GUIDELINES ANTERIOR CERVICAL DISECTOMY AND FUSION  Activity Discharge 2 weeks 6 weeks 3 months 6 months 1 year  Shower 5 days        Submerge the wound  no no yes     Walking outdoors yes       Lifting 5 lbs yes       Climbing stairs yes       Cooking yes       Car rides (less than 30 minutes) yes       Car rides (greater than 30 minutes) no varies yes     Air travel no varies yes     Short outings [employer], visits, etc...) yes       School no no yes     Driving a car no no varies yes    Light upper extremity exercises no no varies yes    Stationary bike no no yes     Swimming (no diving) no no no varies yes   Vacuuming, laundry, mopping no no no varies yes   Biking outdoors no no no no varies yes  Light jogging no no no varies yes   Low impact aerobics no no no varies yes   Non-contact sports (tennis, golf) no no no varies yes   Hunting (no tree climbing) no no no varies yes   Dancing (non-gymnastics) no no no varies yes   Down-hill skiing (experienced skier) no no no no yes   Down-hill skiing (novice) no no no no yes   Cross-country skiing no no no no yes   Horseback riding (noncompetitive)  no no no no yes   Horseback riding (competitive) no no no no varies yes  Gardening/landscaping no no no varies yes   House repairs no no no varies varies yes  Lifting up to 50 lbs no no no no varies yes

## 2024-01-27 NOTE — Brief Op Note (Signed)
 01/27/2024  9:30 AM  PATIENT:  Jordan Kaiser  63 y.o. 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

## 2024-01-27 NOTE — Op Note (Signed)
 OPERATIVE REPORT  DATE OF SURGERY: 01/27/2024  PATIENT NAME:  Jordan Kaiser MRN: [id number] DOB: [birth year]  PCP: Karleen Overall, PA  PRE-OPERATIVE DIAGNOSIS: Cervical exostosis causing dysphagia C4-5.  Prior C5-7 ACDF  POST-OPERATIVE DIAGNOSIS: Same  PROCEDURE:   Removal of anterior cervical exostosis C4-5  SURGEON:  Mort Ards, MD  PHYSICIAN ASSISTANT: Letty Raya, PA  ANESTHESIA:   General  EBL: 50 ml   Complications: None  BRIEF HISTORY: Jordan Kaiser is a 63 y.o. male who had a two-level ACDF approximately 11 years ago and has done very well.  Over the last couple of years he has noticed progressive difficulty swallowing.  X-rays demonstrated large C4-5 exostosis causing compression to the esophagus.  As a result of the progressive dysphagia we elected to move forward with surgery to remove the bone spur to help reduce his dysphagia.  We discussed treatment options including risks, benefits, and alternatives and all of his questions were addressed.  Consent was obtained.  PROCEDURE DETAILS: Patient was brought into the operating room and was properly positioned on the operating room table.  After induction with general anesthesia the patient was endotracheally intubated.  A timeout was taken to confirm all important data: including patient, procedure, and the level. Teds, SCD's were applied.   The anterior cervical spine was prepped and draped.  Identified the prior left anterior surgery site and this was centered over the C6 vertebral body.  I elected to create a new incision that was centered over the C4-5 bone spur.  I marked out my incision and infiltrated with quarter percent Marcaine  with epinephrine .  Transverse incision was made and sharp dissection was carried out down to platysma.  I isolated the platysma and divided it.  There was still a generous amount of scar tissue and so I sharply dissected along the medial border of the sternocleidomastoid in the  avascular pain.  The external jugular came into view and great care was taken to preserve this.  I continued to sharply dissect down until I could palpate the carotid artery.  I then began sweeping medially I was able to palpate the bone spur.  I noted that the esophagus was significantly compressed between the bone spur and the trachea.  On imaging studies the bone spur seem to contact the posterior aspect of the endotracheal tube indicating the severity of the compression.  After gentle manipulation I was able to mobilize the esophagus and trachea and move them over to the right-hand side and placed a hand-held retractor to retract and protect structures.  I now had expose the anterior surface of the cervical spine.  Using Kellogg I mobilized the remainder of the prevertebral fascia until the anterior bone spur was visualized.  Using an osteotome I resected the bulk of the osteophyte.  I then used Kitner rongeurs to remove the portion of bone spur that was directly over the disc space.  Care was taken not to aggressively violate the disc space.  I then used double-action Leksell rongeur to remove the remaining portion of the osteophyte.  Inferiorly I removed as much of the bone spurs I could without violating the cervical plate.  At this point I took x-rays and there was now a significant space between the anterior aspect of the cervical vertebrae and the endotracheal tube.  I irrigated wound copiously normal saline and the bleeding edges of bone were sealed with bone wax.  I then placed Floseal and allowed it to stay for approximately  5 minutes.  I then reirrigated the wound and used bipolar cautery to obtain hemostasis.  Once I confirmed hemostasis I irrigated the wound again and made sure that there was no bleeding noted in the irrigation fluid that I was removing.  Final x-rays demonstrated satisfactory removal of the exostosis with significant improvement in the lateral x-ray.  I returned the  trachea and esophagus to midline and then closed the platysma with interrupted 2-0 Vicryl suture.  Skin was closed with 3-0 Monocryl.  Steri-Strips and a soft collar were applied and the patient was ultimately extubated and transferred the PACU without incident.  The end of the case all needle sponge counts were correct.  There were no adverse intraoperative events.  Mort Ards, MD 01/27/2024 9:23 AM

## 2024-01-27 NOTE — H&P (Signed)
 History:  Jordan Kaiser is a very pleasant 63 year old gentleman who underwent a two-level ACDF C5-7 in 2014. He has done exceptionally well except for increasing dysphagia. Imaging has shown a large anterior exostosis formation at C4-5 that is compressing the esophagus. At this point I am the dysphagia has progressed to the point where it is significantly impacting his quality of life. As result he is elected to move forward with removal of the osteophyte.  Past Medical History:  Diagnosis Date   Asthma    as a child   Headache(784.0)    due to neck problems   Hypertension    Seasonal allergies     No Known Allergies  No current facility-administered medications on file prior to encounter.   Current Outpatient Medications on File Prior to Encounter  Medication Sig Dispense Refill   cetirizine (ZYRTEC) 10 MG tablet Take 10 mg by mouth daily as needed for allergies.     lisinopril-hydrochlorothiazide  (ZESTORETIC) 20-25 MG tablet Take 1 tablet by mouth daily.     Vitamin D, Ergocalciferol, (DRISDOL) 1.25 MG (50000 UNIT) CAPS capsule Take 50,000 Units by mouth 2 (two) times a week.     albuterol  (VENTOLIN  HFA) 108 (90 Base) MCG/ACT inhaler Inhale 1-2 puffs into the lungs every 6 (six) hours as needed for wheezing or shortness of breath. 8 g 0   benzonatate  (TESSALON ) 100 MG capsule Take 1 capsule (100 mg total) by mouth 3 (three) times daily as needed for cough. (Patient not taking: Reported on 01/13/2024) 21 capsule 0   doxycycline  (VIBRAMYCIN ) 100 MG capsule Take 1 capsule (100 mg total) by mouth 2 (two) times daily. Take with food (Rx void after 12/14/18) (Patient not taking: Reported on 01/13/2024) 14 capsule 0   ondansetron  (ZOFRAN -ODT) 4 MG disintegrating tablet Take 1 tablet (4 mg total) by mouth every 8 (eight) hours as needed for nausea or vomiting. (Patient not taking: Reported on 01/13/2024) 15 tablet 0   oxyCODONE -acetaminophen  (PERCOCET/ROXICET) 5-325 MG per tablet Take 1-2 tablets  by mouth every 4 (four) hours as needed for severe pain. (Patient not taking: Reported on 01/13/2024) 60 tablet 0   pantoprazole  (PROTONIX ) 40 MG tablet Take 1 tablet (40 mg total) by mouth daily. (Patient not taking: Reported on 01/13/2024) 30 tablet 3   predniSONE  (STERAPRED UNI-PAK 21 TAB) 10 MG (21) TBPK tablet Take by mouth daily. Take 6 tabs by mouth daily  for 2 days, then 5 tabs for 2 days, then 4 tabs for 2 days, then 3 tabs for 2 days, 2 tabs for 2 days, then 1 tab by mouth daily for 2 days (Patient not taking: Reported on 01/13/2024) 42 tablet 0    Physical Exam: Vitals:   01/27/24 0554  BP: 131/79  Pulse: 68  Resp: 17  Temp: 98.1 F (36.7 C)  SpO2: 96%   Body mass index is 24.39 kg/m. Clinical exam: Jordan Kaiser is a pleasant individual, who appears younger than their stated age.  He is alert and orientated 3.  No shortness of breath, chest pain.  Abdomen is soft and non-tender, negative loss of bowel and bladder control, no rebound tenderness. Patient notes significant dysphagia to both liquids and solids. No history of aspiration but he notes it is getting progressively worse.  Negative: skin lesions abrasions contusions  Peripheral pulses: 2+ peripheral pulses bilaterally. LE compartments are: Soft and nontender.  Gait pattern: Normal  Assistive devices: None  Neuro: Intermittent dysesthesias in the left upper extremity. 5/5 motor strength  in the upper extremity. Negative Spurling sign, negative Hoffman test. Symmetrical 1+ deep tendon reflexes  Musculoskeletal: Well-healed surgical scar from previous two-level ACDF (2014). Mild neck pain with range of motion. Positive pain and tenderness with palpation anteriorly especially with swallowing.  Imaging: X-rays show a solid two-level fusion C5-7. There is a large anterior exostosis at C4-5 that is compressing the esophagus.  Cervical MRI: completed on 12/22/2023. No residual stenosis C5-6 or C6-7. Moderate bilateral  foraminal disc osteophyte complexes at C7-T1 producing severe left and moderate right foraminal narrowing. No significant central or foraminal stenosis C3-4. No significant foraminal or central stenosis C4-5.  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 Over the course of the last several years he has developed a large anterior exostosis at C4-5. The patient has had progressive dysphagia which is now significantly impacting his quality of life. He does get occasional C8 dysesthesias but he notes that the arm symptoms are mild compared to the dysphagia. The patient has a sensation of choking and great difficulty with solids and liquids. X-rays clearly show a very large anterior exostosis at C4-5 compressing the esophagus.  At this point I am due to the progressive loss and quality of life and worsening symptoms of dysphagia he is expressed a desire to move forward with surgery. Surgical plan would be to remove the anterior exostosis. He has no significant disc disease or stenosis at that level and so there is no need for an ACDF at this level.  Postoperatively, we will arrange for a single dose radiation treatment to help prevent recurrence of the heterotopic ossification. I have gone over the surgical procedure in great detail and all of his questions were addressed and he is expressed a willingness to move forward with surgery.  Risks and benefits of surgery were discussed with the patient. These include: Infection, bleeding, death, stroke, paralysis, ongoing or worse pain, need for additional surgery, adjacent segment degeneration requiring additional fusion surgery. Throat pain, swallowing difficulties, hoarseness or change in voice.

## 2024-01-27 NOTE — Anesthesia Postprocedure Evaluation (Signed)
 Anesthesia Post Note  Patient: Jordan Kaiser  Procedure(s) Performed: EXCISION, EXOSTOSIS, SPINE, CERVICAL     Patient location during evaluation: PACU Anesthesia Type: General Level of consciousness: awake and alert Pain management: pain level controlled Vital Signs Assessment: post-procedure vital signs reviewed and stable Respiratory status: spontaneous breathing, nonlabored ventilation and respiratory function stable Cardiovascular status: blood pressure returned to baseline Postop Assessment: no apparent nausea or vomiting Anesthetic complications: no   No notable events documented.  Last Vitals:  Vitals:   01/27/24 1000 01/27/24 1015  BP: 139/86 132/83  Pulse: 71 74  Resp: (!) 9 10  Temp:  36.4 C  SpO2: 98% 97%                  Rayfield Cairo

## 2024-01-28 ENCOUNTER — Encounter (HOSPITAL_COMMUNITY): Payer: Self-pay | Admitting: Orthopedic Surgery

## 2024-07-11 ENCOUNTER — Telehealth (INDEPENDENT_AMBULATORY_CARE_PROVIDER_SITE_OTHER): Payer: Self-pay | Admitting: Otolaryngology

## 2024-07-11 NOTE — Telephone Encounter (Signed)
 Called patient and LVM stating the appointment for 07/12/2024 was cancelled due to the [facility] not done yet.  Let them know that once they scheduled the Swallow Study to call us  back and we will reschedule the appointment with Dr. Soldatova.

## 2024-07-12 ENCOUNTER — Ambulatory Visit (INDEPENDENT_AMBULATORY_CARE_PROVIDER_SITE_OTHER): Admitting: Otolaryngology
